# Patient Record
Sex: Female | Born: 1965
Health system: Southern US, Community
[De-identification: ages and names within clinical notes are randomized; demographics above are authoritative.]

## PROBLEM LIST (undated history)

## (undated) DIAGNOSIS — I10 Essential (primary) hypertension: Secondary | ICD-10-CM

## (undated) DIAGNOSIS — T7840XA Allergy, unspecified, initial encounter: Secondary | ICD-10-CM

## (undated) DIAGNOSIS — J302 Other seasonal allergic rhinitis: Secondary | ICD-10-CM

## (undated) HISTORY — DX: Other seasonal allergic rhinitis: J30.2

## (undated) HISTORY — PX: BREAST SURGERY: SHX581

## (undated) HISTORY — PX: ABDOMINOPLASTY: SUR9

## (undated) HISTORY — DX: Allergy, unspecified, initial encounter: T78.40XA

---
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
## 1998-03-29 ENCOUNTER — Other Ambulatory Visit: Admission: RE | Admit: 1998-03-29 | Discharge: 1998-03-29 | Payer: Self-pay | Admitting: Obstetrics

## 1998-04-14 ENCOUNTER — Encounter: Admission: RE | Admit: 1998-04-14 | Discharge: 1998-04-14 | Payer: Self-pay | Admitting: *Deleted

## 1998-08-08 ENCOUNTER — Emergency Department (HOSPITAL_COMMUNITY): Admission: EM | Admit: 1998-08-08 | Discharge: 1998-08-08 | Payer: Self-pay | Admitting: Emergency Medicine

## 1999-04-20 ENCOUNTER — Encounter: Admission: RE | Admit: 1999-04-20 | Discharge: 1999-04-20 | Payer: Self-pay | Admitting: Pediatrics

## 1999-04-22 ENCOUNTER — Encounter: Admission: RE | Admit: 1999-04-22 | Discharge: 1999-07-21 | Payer: Self-pay | Admitting: *Deleted

## 1999-10-10 ENCOUNTER — Encounter: Admission: RE | Admit: 1999-10-10 | Discharge: 1999-10-10 | Payer: Self-pay | Admitting: Family Medicine

## 1999-12-09 ENCOUNTER — Encounter: Admission: RE | Admit: 1999-12-09 | Discharge: 1999-12-09 | Payer: Self-pay | Admitting: Family Medicine

## 1999-12-12 ENCOUNTER — Encounter: Admission: RE | Admit: 1999-12-12 | Discharge: 1999-12-12 | Payer: Self-pay | Admitting: Family Medicine

## 2000-05-24 ENCOUNTER — Emergency Department (HOSPITAL_COMMUNITY): Admission: EM | Admit: 2000-05-24 | Discharge: 2000-05-24 | Payer: Self-pay | Admitting: Emergency Medicine

## 2001-06-27 ENCOUNTER — Emergency Department (HOSPITAL_COMMUNITY): Admission: EM | Admit: 2001-06-27 | Discharge: 2001-06-27 | Payer: Self-pay

## 2002-04-15 ENCOUNTER — Emergency Department (HOSPITAL_COMMUNITY): Admission: EM | Admit: 2002-04-15 | Discharge: 2002-04-15 | Payer: Self-pay | Admitting: Emergency Medicine

## 2004-05-16 ENCOUNTER — Ambulatory Visit (HOSPITAL_COMMUNITY): Admission: RE | Admit: 2004-05-16 | Discharge: 2004-05-16 | Payer: Self-pay | Admitting: Obstetrics and Gynecology

## 2005-10-23 ENCOUNTER — Emergency Department (HOSPITAL_COMMUNITY): Admission: EM | Admit: 2005-10-23 | Discharge: 2005-10-23 | Payer: Self-pay | Admitting: Emergency Medicine

## 2006-06-12 ENCOUNTER — Emergency Department (HOSPITAL_COMMUNITY): Admission: EM | Admit: 2006-06-12 | Discharge: 2006-06-13 | Payer: Self-pay | Admitting: Emergency Medicine

## 2006-07-02 ENCOUNTER — Ambulatory Visit: Payer: Self-pay | Admitting: Family Medicine

## 2006-07-16 ENCOUNTER — Ambulatory Visit: Payer: Self-pay | Admitting: Family Medicine

## 2006-10-03 ENCOUNTER — Ambulatory Visit: Payer: Self-pay | Admitting: Family Medicine

## 2006-10-03 LAB — CONVERTED CEMR LAB
ALT: 21 units/L (ref 0–40)
AST: 27 units/L (ref 0–37)
Albumin: 3.8 g/dL (ref 3.5–5.2)
Alkaline Phosphatase: 55 units/L (ref 39–117)
BUN: 8 mg/dL (ref 6–23)
Basophils Absolute: 0 10*3/uL (ref 0.0–0.1)
Basophils Relative: 0.1 % (ref 0.0–1.0)
CO2: 28 meq/L (ref 19–32)
Calcium: 9.4 mg/dL (ref 8.4–10.5)
Chloride: 102 meq/L (ref 96–112)
Chol/HDL Ratio, serum: 2.9
Cholesterol: 178 mg/dL (ref 0–200)
Creatinine, Ser: 1.1 mg/dL (ref 0.4–1.2)
Eosinophil percent: 3.6 % (ref 0.0–5.0)
GFR calc non Af Amer: 58 mL/min
Glomerular Filtration Rate, Af Am: 70 mL/min/{1.73_m2}
Glucose, Bld: 91 mg/dL (ref 70–99)
HCT: 41.5 % (ref 36.0–46.0)
HDL: 61 mg/dL (ref >39.0)
Hemoglobin: 13.2 g/dL (ref 12.0–15.0)
LDL Cholesterol: 102 mg/dL — ABNORMAL HIGH (ref 0–99)
Lymphocytes Relative: 32.8 % (ref 12.0–46.0)
MCHC: 31.8 g/dL (ref 30.0–36.0)
MCV: 86 fL (ref 78.0–100.0)
Monocytes Absolute: 0.3 10*3/uL (ref 0.2–0.7)
Monocytes Relative: 5.7 % (ref 3.0–11.0)
Neutro Abs: 2.9 10*3/uL (ref 1.4–7.7)
Neutrophils Relative %: 57.8 % (ref 43.0–77.0)
Platelets: 317 10*3/uL (ref 150–400)
Potassium: 4.3 meq/L (ref 3.5–5.1)
RBC: 4.83 M/uL (ref 3.87–5.11)
RDW: 14.1 % (ref 11.5–14.6)
Sodium: 137 meq/L (ref 135–145)
TSH: 1.11 microintl units/mL (ref 0.35–5.50)
Total Bilirubin: 0.6 mg/dL (ref 0.3–1.2)
Total Protein: 6.8 g/dL (ref 6.0–8.3)
Triglyceride fasting, serum: 75 mg/dL (ref 0–149)
Uric Acid, Serum: 5.5 mg/dL (ref 2.4–7.0)
VLDL: 15 mg/dL (ref 0–40)
WBC: 5 10*3/uL (ref 4.5–10.5)

## 2006-10-04 ENCOUNTER — Ambulatory Visit: Payer: Self-pay | Admitting: Family Medicine

## 2006-10-10 ENCOUNTER — Encounter: Admission: RE | Admit: 2006-10-10 | Discharge: 2006-10-10 | Payer: Self-pay | Admitting: Family Medicine

## 2006-11-22 ENCOUNTER — Ambulatory Visit: Payer: Self-pay | Admitting: Obstetrics & Gynecology

## 2006-11-22 ENCOUNTER — Encounter: Payer: Self-pay | Admitting: Obstetrics & Gynecology

## 2007-02-19 DIAGNOSIS — I10 Essential (primary) hypertension: Secondary | ICD-10-CM | POA: Insufficient documentation

## 2007-03-13 ENCOUNTER — Emergency Department (HOSPITAL_COMMUNITY): Admission: EM | Admit: 2007-03-13 | Discharge: 2007-03-14 | Payer: Self-pay | Admitting: Emergency Medicine

## 2007-03-15 ENCOUNTER — Inpatient Hospital Stay (HOSPITAL_COMMUNITY): Admission: AD | Admit: 2007-03-15 | Discharge: 2007-03-15 | Payer: Self-pay | Admitting: Obstetrics & Gynecology

## 2007-03-16 ENCOUNTER — Inpatient Hospital Stay (HOSPITAL_COMMUNITY): Admission: AD | Admit: 2007-03-16 | Discharge: 2007-03-16 | Payer: Self-pay | Admitting: Family Medicine

## 2007-03-24 ENCOUNTER — Inpatient Hospital Stay (HOSPITAL_COMMUNITY): Admission: AD | Admit: 2007-03-24 | Discharge: 2007-03-24 | Payer: Self-pay | Admitting: Family Medicine

## 2007-03-31 ENCOUNTER — Inpatient Hospital Stay (HOSPITAL_COMMUNITY): Admission: AD | Admit: 2007-03-31 | Discharge: 2007-03-31 | Payer: Self-pay | Admitting: Obstetrics & Gynecology

## 2007-07-04 ENCOUNTER — Ambulatory Visit: Payer: Self-pay | Admitting: Obstetrics & Gynecology

## 2007-07-16 ENCOUNTER — Ambulatory Visit: Payer: Self-pay | Admitting: Family Medicine

## 2008-05-26 ENCOUNTER — Ambulatory Visit: Payer: Self-pay | Admitting: Family Medicine

## 2008-06-09 ENCOUNTER — Encounter (INDEPENDENT_AMBULATORY_CARE_PROVIDER_SITE_OTHER): Payer: Self-pay | Admitting: *Deleted

## 2008-06-09 ENCOUNTER — Ambulatory Visit: Payer: Self-pay | Admitting: Family Medicine

## 2009-04-09 ENCOUNTER — Emergency Department (HOSPITAL_COMMUNITY): Admission: EM | Admit: 2009-04-09 | Discharge: 2009-04-09 | Payer: Self-pay | Admitting: Emergency Medicine

## 2009-07-28 ENCOUNTER — Encounter: Payer: Self-pay | Admitting: Family Medicine

## 2009-07-28 ENCOUNTER — Emergency Department (HOSPITAL_COMMUNITY): Admission: EM | Admit: 2009-07-28 | Discharge: 2009-07-28 | Payer: Self-pay | Admitting: Emergency Medicine

## 2009-08-06 ENCOUNTER — Ambulatory Visit: Payer: Self-pay | Admitting: Family Medicine

## 2009-08-06 DIAGNOSIS — R079 Chest pain, unspecified: Secondary | ICD-10-CM | POA: Insufficient documentation

## 2009-08-06 DIAGNOSIS — K219 Gastro-esophageal reflux disease without esophagitis: Secondary | ICD-10-CM | POA: Insufficient documentation

## 2009-08-09 ENCOUNTER — Encounter: Admission: RE | Admit: 2009-08-09 | Discharge: 2009-08-09 | Payer: Self-pay | Admitting: Family Medicine

## 2010-03-03 ENCOUNTER — Encounter: Admission: RE | Admit: 2010-03-03 | Discharge: 2010-03-03 | Payer: Self-pay | Admitting: Specialist

## 2010-11-15 ENCOUNTER — Inpatient Hospital Stay (HOSPITAL_COMMUNITY)
Admission: AD | Admit: 2010-11-15 | Discharge: 2010-11-15 | Payer: Self-pay | Source: Home / Self Care | Attending: Obstetrics and Gynecology | Admitting: Obstetrics and Gynecology

## 2010-11-21 LAB — POCT PREGNANCY, URINE: Preg Test, Ur: NEGATIVE

## 2010-11-21 LAB — URINALYSIS, ROUTINE W REFLEX MICROSCOPIC
Bilirubin Urine: NEGATIVE
Ketones, ur: NEGATIVE mg/dL
Nitrite: POSITIVE — AB
Protein, ur: NEGATIVE mg/dL
Specific Gravity, Urine: >1.03 — ABNORMAL HIGH (ref 1.005–1.030)
Urine Glucose, Fasting: NEGATIVE mg/dL
Urobilinogen, UA: 1 mg/dL (ref 0.0–1.0)
pH: 5.5 (ref 5.0–8.0)

## 2010-11-21 LAB — URINE MICROSCOPIC-ADD ON

## 2010-11-21 LAB — WET PREP, GENITAL
Clue Cells Wet Prep HPF POC: NONE SEEN
Trich, Wet Prep: NONE SEEN

## 2010-11-27 ENCOUNTER — Encounter: Payer: Self-pay | Admitting: Family Medicine

## 2010-12-29 ENCOUNTER — Encounter: Payer: Self-pay | Admitting: Obstetrics and Gynecology

## 2011-02-10 LAB — BASIC METABOLIC PANEL
BUN: 10 mg/dL (ref 6–23)
CO2: 32 mEq/L (ref 19–32)
Calcium: 9.6 mg/dL (ref 8.4–10.5)
Chloride: 102 mEq/L (ref 96–112)
Creatinine, Ser: 1.03 mg/dL (ref 0.4–1.2)
GFR calc Af Amer: >60 mL/min (ref >60)
GFR calc non Af Amer: 58 mL/min — ABNORMAL LOW (ref >60)
Glucose, Bld: 104 mg/dL — ABNORMAL HIGH (ref 70–99)
Potassium: 3.7 mEq/L (ref 3.5–5.1)
Sodium: 139 mEq/L (ref 135–145)

## 2011-02-10 LAB — CBC
HCT: 40.8 % (ref 36.0–46.0)
Hemoglobin: 13.3 g/dL (ref 12.0–15.0)
MCHC: 32.6 g/dL (ref 30.0–36.0)
MCV: 86.5 fL (ref 78.0–100.0)
Platelets: 273 10*3/uL (ref 150–400)
RBC: 4.71 MIL/uL (ref 3.87–5.11)
RDW: 15.5 % (ref 11.5–15.5)
WBC: 5.9 10*3/uL (ref 4.0–10.5)

## 2011-02-10 LAB — POCT CARDIAC MARKERS
CKMB, poc: <1 ng/mL — ABNORMAL LOW (ref 1.0–8.0)
Myoglobin, poc: 75.2 ng/mL (ref 12–200)
Troponin i, poc: <0.05 ng/mL (ref 0.00–0.09)

## 2011-02-10 LAB — DIFFERENTIAL
Basophils Absolute: 0.1 10*3/uL (ref 0.0–0.1)
Basophils Relative: 1 % (ref 0–1)
Eosinophils Absolute: 0.1 10*3/uL (ref 0.0–0.7)
Eosinophils Relative: 2 % (ref 0–5)
Lymphocytes Relative: 32 % (ref 12–46)
Lymphs Abs: 1.9 10*3/uL (ref 0.7–4.0)
Monocytes Absolute: 0.2 10*3/uL (ref 0.1–1.0)
Monocytes Relative: 4 % (ref 3–12)
Neutro Abs: 3.6 10*3/uL (ref 1.7–7.7)
Neutrophils Relative %: 61 % (ref 43–77)

## 2011-02-10 LAB — URINALYSIS, ROUTINE W REFLEX MICROSCOPIC
Bilirubin Urine: NEGATIVE
Glucose, UA: NEGATIVE mg/dL
Hgb urine dipstick: NEGATIVE
Ketones, ur: NEGATIVE mg/dL
Nitrite: NEGATIVE
Protein, ur: NEGATIVE mg/dL
Specific Gravity, Urine: 1.015 (ref 1.005–1.030)
Urobilinogen, UA: 0.2 mg/dL (ref 0.0–1.0)
pH: 6 (ref 5.0–8.0)

## 2011-02-10 LAB — PREGNANCY, URINE: Preg Test, Ur: NEGATIVE

## 2011-02-10 LAB — D-DIMER, QUANTITATIVE: D-Dimer, Quant: <0.22 ug/mL-FEU (ref 0.00–0.48)

## 2011-02-13 LAB — COMPREHENSIVE METABOLIC PANEL
ALT: 16 U/L (ref 0–35)
AST: 16 U/L (ref 0–37)
Albumin: 3.9 g/dL (ref 3.5–5.2)
Alkaline Phosphatase: 56 U/L (ref 39–117)
BUN: 11 mg/dL (ref 6–23)
CO2: 28 mEq/L (ref 19–32)
Calcium: 10 mg/dL (ref 8.4–10.5)
Chloride: 105 mEq/L (ref 96–112)
Creatinine, Ser: 1.09 mg/dL (ref 0.4–1.2)
GFR calc Af Amer: >60 mL/min (ref >60)
GFR calc non Af Amer: 54 mL/min — ABNORMAL LOW (ref >60)
Glucose, Bld: 98 mg/dL (ref 70–99)
Potassium: 4 mEq/L (ref 3.5–5.1)
Sodium: 140 mEq/L (ref 135–145)
Total Bilirubin: 0.3 mg/dL (ref 0.3–1.2)
Total Protein: 7.2 g/dL (ref 6.0–8.3)

## 2011-02-13 LAB — CBC
HCT: 40.3 % (ref 36.0–46.0)
Hemoglobin: 12.9 g/dL (ref 12.0–15.0)
MCHC: 32 g/dL (ref 30.0–36.0)
MCV: 85.9 fL (ref 78.0–100.0)
Platelets: 250 10*3/uL (ref 150–400)
RBC: 4.69 MIL/uL (ref 3.87–5.11)
RDW: 15.4 % (ref 11.5–15.5)
WBC: 6.7 10*3/uL (ref 4.0–10.5)

## 2011-02-13 LAB — URINALYSIS, ROUTINE W REFLEX MICROSCOPIC
Bilirubin Urine: NEGATIVE
Glucose, UA: NEGATIVE mg/dL
Hgb urine dipstick: NEGATIVE
Ketones, ur: NEGATIVE mg/dL
Nitrite: NEGATIVE
Protein, ur: NEGATIVE mg/dL
Specific Gravity, Urine: 1.024 (ref 1.005–1.030)
Urobilinogen, UA: 1 mg/dL (ref 0.0–1.0)
pH: 6.5 (ref 5.0–8.0)

## 2011-02-13 LAB — URINE MICROSCOPIC-ADD ON

## 2011-02-13 LAB — DIFFERENTIAL
Basophils Absolute: 0.1 10*3/uL (ref 0.0–0.1)
Basophils Relative: 1 % (ref 0–1)
Eosinophils Absolute: 0.1 10*3/uL (ref 0.0–0.7)
Eosinophils Relative: 2 % (ref 0–5)
Lymphocytes Relative: 34 % (ref 12–46)
Lymphs Abs: 2.3 10*3/uL (ref 0.7–4.0)
Monocytes Absolute: 0.3 10*3/uL (ref 0.1–1.0)
Monocytes Relative: 5 % (ref 3–12)
Neutro Abs: 3.9 10*3/uL (ref 1.7–7.7)
Neutrophils Relative %: 58 % (ref 43–77)

## 2011-02-13 LAB — GC/CHLAMYDIA PROBE AMP, GENITAL
Chlamydia, DNA Probe: NEGATIVE
GC Probe Amp, Genital: NEGATIVE

## 2011-02-13 LAB — WET PREP, GENITAL

## 2011-03-21 NOTE — Group Therapy Note (Signed)
Sharon [name], Sharon [name] NO.:  [id number]   MEDICAL RECORD NO.:  [id number]          PATIENT TYPE:  WOC   LOCATION:  [facility]                   FACILITY:  [facility]   PHYSICIAN:  Johnella Moloney, MD        DATE OF BIRTH:  02/05/1966   DATE OF SERVICE:  07/04/2007                                  CLINIC NOTE   CHIEF COMPLAINT:  Lower abdominal pain, urinary frequency and urgency.   HISTORY OF PRESENT ILLNESS:  The patient is a 45 year old gravida 3,  para 2 who is here with a complaint of lower abdominal pain, increased  urinary frequency and urgency. The patient reported that her symptoms  started 2 weeks ago and denies any fevers, chills, sweats, dysuria or  hematuria or any other symptoms.  Of note, patient also reports that she  has a new partner in her life and is interested in information about  getting pregnant at age 45.  The patient denies any other symptoms.  The  patient has no interval change in medical history. For further details  please refer to Dr. Leanora Ivanoff Dove's note on November 22, 2006.  She had a  normal Pap smear in January 2008 and normal mammogram in December 2007.   PHYSICAL EXAMINATION:  Temperature 97.2, pulse is 63, blood pressure  142/94, weight 219 pounds.  GENERAL:  In no apparent distress.  ABDOMEN:  Soft.  Mild suprapubic tenderness on palpation.  No rebound or  guarding.  PELVIC:  Exam deferred.   ASSESSMENT/PLAN:  The patient is a 45 year old G3, P3 here for her lower  abdominal pain and urinary frequency and urgency.  Given her symptoms  and suprapubic pain noted on examination we will empirically prescribe  ciprofloxacin 500 mg p.o. t.i.d. for 7 days for treatment of a urinary  tract infection.  We will also send urinalysis and urine culture and  sensitivity for further evaluation.  The patient was told to call in a  few days for results of these evaluations.  As for patient's question of  getting pregnant at age 45, she was told that  she is at increased risk  of chromosomal abnormalities and also increased risk of miscarriages.  The patient was advised that if she was trying to get pregnant that she  should be on prenatal vitamins but to discuss with her partner that this  will be a high-risk pregnancy if she does conceive at this age. The  patient verbalized understanding of plan and will follow up results of  her lab tests.           ______________________________  Johnella Moloney, MD     UD/MEDQ  D:  07/04/2007  T:  07/05/2007  Job:  [id number]

## 2011-03-24 NOTE — Group Therapy Note (Signed)
NAME:  MAIGEN, MOZINGO NO.:  [id number]   MEDICAL RECORD NO.:  [id number]          PATIENT TYPE:  WOC   LOCATION:  [facility]                   FACILITY:  [facility]   PHYSICIAN:  Allie Bossier, MD        DATE OF BIRTH:  [birth year]   DATE OF SERVICE:                                  CLINIC NOTE   Ms. Vankirk is a 45 year old single black gravida 3, para 2, abortus 1,  with 2 teenage sons who comes in for her Pap smear.  She reports that  she had her physical exam, including breast exam, done by Dr. Loreen Freud at [facility] last month.  She had a mammogram done at that  time, which was reportedly normal.  She comes in for her Pap and pelvic.  She also complains of a 1-year history of stress incontinence becoming  worse over the last several months.  Her stress incontinence actually  began at the birth of her second child 13 years ago, but resolved within  a year and has recurred recently.  She has tried Kegels in the past and  has tried them recently without good results at this point.   PAST MEDICAL HISTORY:  Significant for obesity, chronic hypertension,  and the new-onset stress incontinence.   SURGICAL HISTORY:  She had a D&C for retained products.  She had a  breast biopsy at age 53 to 45 years of age.   REVIEW OF SYSTEMS:  She works at the [employer].  She uses condoms when  she has sex.  She reports that she has sex approximately monthly and has  reported some right lower quadrant dyspareunia for the last 2 months.   No known drug allergies.   MEDICATIONS:  1. Hydrochlorothiazide 12.5 mg daily.  2. She takes multivitamins on a +/- basis.   PHYSICAL EXAM:  Weight 218 pounds, blood pressure 174/105 (she did not  take her medicine today).  PELVIC:  External genitalia is normal.  Cervix is normal.  Her uterus is  10 week size, globular, consistent with fibroid.  It is mobile and  nontender.  Adnexa are nontender without masses and no enlargement.   ASSESSMENT AND PLAN:  1. Annual exam.  Obtained a Pap smear.  2. With regard to her new-onset dyspareunia, I have obtained GC and      chlamydia cultures with her Pap smear.  3. With regard to her stress incontinence, I have discussed my      recommendation for a TVT sling along with its benefits and risks.      She will schedule a preoperative visit if she decides to.  I have      given her information on the      web site for further information.  Of course, I have recommended      she take her antihypertensives and lose weight.      Allie Bossier, MD     MCD/MEDQ  D:  11/22/2006  T:  11/22/2006  Job:  [id number]

## 2011-11-15 ENCOUNTER — Ambulatory Visit (INDEPENDENT_AMBULATORY_CARE_PROVIDER_SITE_OTHER): Payer: Federal, State, Local not specified - PPO

## 2011-11-15 ENCOUNTER — Ambulatory Visit: Payer: Federal, State, Local not specified - PPO

## 2011-11-15 DIAGNOSIS — Z0000 Encounter for general adult medical examination without abnormal findings: Secondary | ICD-10-CM

## 2011-11-15 DIAGNOSIS — M542 Cervicalgia: Secondary | ICD-10-CM

## 2011-11-15 DIAGNOSIS — IMO0001 Reserved for inherently not codable concepts without codable children: Secondary | ICD-10-CM

## 2011-11-15 DIAGNOSIS — N898 Other specified noninflammatory disorders of vagina: Secondary | ICD-10-CM

## 2011-12-14 ENCOUNTER — Encounter (HOSPITAL_BASED_OUTPATIENT_CLINIC_OR_DEPARTMENT_OTHER): Payer: Self-pay | Admitting: *Deleted

## 2011-12-14 ENCOUNTER — Emergency Department (HOSPITAL_BASED_OUTPATIENT_CLINIC_OR_DEPARTMENT_OTHER)
Admission: EM | Admit: 2011-12-14 | Discharge: 2011-12-14 | Disposition: A | Discharge status: Home / Self Care | Payer: Federal, State, Local not specified - PPO | Attending: Emergency Medicine | Admitting: Emergency Medicine

## 2011-12-14 DIAGNOSIS — R109 Unspecified abdominal pain: Secondary | ICD-10-CM | POA: Insufficient documentation

## 2011-12-14 DIAGNOSIS — B9689 Other specified bacterial agents as the cause of diseases classified elsewhere: Secondary | ICD-10-CM | POA: Insufficient documentation

## 2011-12-14 DIAGNOSIS — A499 Bacterial infection, unspecified: Secondary | ICD-10-CM | POA: Insufficient documentation

## 2011-12-14 DIAGNOSIS — N760 Acute vaginitis: Secondary | ICD-10-CM | POA: Insufficient documentation

## 2011-12-14 DIAGNOSIS — N912 Amenorrhea, unspecified: Secondary | ICD-10-CM | POA: Insufficient documentation

## 2011-12-14 DIAGNOSIS — I10 Essential (primary) hypertension: Secondary | ICD-10-CM | POA: Insufficient documentation

## 2011-12-14 HISTORY — DX: Essential (primary) hypertension: I10

## 2011-12-14 LAB — CBC
HCT: 38.3 % (ref 36.0–46.0)
MCH: 27.7 pg (ref 26.0–34.0)
MCHC: 33.9 g/dL (ref 30.0–36.0)
MCV: 81.5 fL (ref 78.0–100.0)
RDW: 14.3 % (ref 11.5–15.5)

## 2011-12-14 LAB — DIFFERENTIAL
Basophils Absolute: 0 10*3/uL (ref 0.0–0.1)
Basophils Relative: 0 % (ref 0–1)
Eosinophils Absolute: 0.1 10*3/uL (ref 0.0–0.7)
Eosinophils Relative: 2 % (ref 0–5)
Monocytes Absolute: 0.5 10*3/uL (ref 0.1–1.0)
Neutro Abs: 3.8 10*3/uL (ref 1.7–7.7)

## 2011-12-14 LAB — URINALYSIS, ROUTINE W REFLEX MICROSCOPIC
Hgb urine dipstick: NEGATIVE
Protein, ur: NEGATIVE mg/dL
Urobilinogen, UA: 1 mg/dL (ref 0.0–1.0)

## 2011-12-14 LAB — BASIC METABOLIC PANEL
Calcium: 10 mg/dL (ref 8.4–10.5)
Creatinine, Ser: 1.2 mg/dL — ABNORMAL HIGH (ref 0.50–1.10)
GFR calc Af Amer: 61 mL/min — ABNORMAL LOW (ref >90)
GFR calc non Af Amer: 53 mL/min — ABNORMAL LOW (ref >90)

## 2011-12-14 LAB — PREGNANCY, URINE: Preg Test, Ur: NEGATIVE

## 2011-12-14 MED ORDER — METRONIDAZOLE 500 MG PO TABS: 500.0000 mg | ORAL_TABLET | Freq: Two times a day (BID) | ORAL | Status: AC

## 2011-12-14 NOTE — ED Notes (Signed)
Irregular periods has taken 2 pregnancy test negative lower abdominal pain no spotting no vaginal discharge

## 2011-12-14 NOTE — ED Provider Notes (Signed)
History     CSN: [id number]  Arrival date & time 12/14/11  1658   First MD Initiated Contact with Patient 12/14/11 1717      Chief Complaint  Patient presents with  . Amenorrhea  . Abdominal Pain    (Consider location/radiation/quality/duration/timing/severity/associated sxs/prior treatment) HPI Comments: Pt states that since October she has had intermittent periods:pt states that she has taken 2 pregnancy test which is negative  Patient is a 46 y.o. female presenting with abdominal pain. The history is provided by the patient. No language interpreter was used.  Abdominal Pain The primary symptoms of the illness include abdominal pain. The primary symptoms of the illness do not include fever, nausea, vomiting, dysuria, vaginal discharge or vaginal bleeding. The current episode started more than 2 days ago. The onset of the illness was gradual. The problem has not changed since onset. The patient states that she believes she is currently not pregnant. The patient has not had a change in bowel habit. Symptoms associated with the illness do not include urgency, hematuria, frequency or back pain.    Past Medical History  Diagnosis Date  . Hypertension     History reviewed. No pertinent past surgical history.  History reviewed. No pertinent family history.  History  Substance Use Topics  . Smoking status: Never Smoker   . Smokeless tobacco: Not on file  . Alcohol Use: No    OB History    Grav Para Term Preterm Abortions TAB SAB Ect Mult Living                  Review of Systems  Constitutional: Negative for fever.  Gastrointestinal: Positive for abdominal pain. Negative for nausea and vomiting.  Genitourinary: Negative for dysuria, urgency, frequency, hematuria, vaginal bleeding and vaginal discharge.  Musculoskeletal: Negative for back pain.  All other systems reviewed and are negative.    Allergies  Codeine  Home Medications   Current Outpatient Rx  Name Route  Sig Dispense Refill  . HYDROCHLOROTHIAZIDE 12.5 MG PO CAPS Oral Take 12.5 mg by mouth every evening.    Marland Kitchen LISINOPRIL 20 MG PO TABS Oral Take 20 mg by mouth every evening.    . ADULT MULTIVITAMIN LIQUID CH Oral Take 5 mLs by mouth daily.      BP 146/88  Pulse 64  Temp 97.5 F (36.4 C)  Resp 18  SpO2 100%  LMP 08/25/2011  Physical Exam  Nursing note and vitals reviewed. Constitutional: She is oriented to person, place, and time. She appears well-developed and well-nourished.  HENT:  Head: Normocephalic and atraumatic.  Eyes: Conjunctivae and EOM are normal.  Cardiovascular: Normal rate and regular rhythm.   Pulmonary/Chest: Effort normal and breath sounds normal.  Abdominal: Soft. Bowel sounds are normal. There is tenderness in the suprapubic area.  Genitourinary: Cervix exhibits no motion tenderness.       Pt is having white vaginal discharge  Musculoskeletal: Normal range of motion.  Neurological: She is alert and oriented to person, place, and time.  Skin: Skin is warm and dry.  Psychiatric: She has a normal mood and affect.    ED Course  Procedures (including critical care time)  Labs Reviewed  URINALYSIS, ROUTINE W REFLEX MICROSCOPIC - Abnormal; Notable for the following:    APPearance CLOUDY (*)    All other components within normal limits  BASIC METABOLIC PANEL - Abnormal; Notable for the following:    Creatinine, Ser 1.20 (*)    GFR calc non Af Amer 53 (*)  GFR calc Af Amer 61 (*)    All other components within normal limits  WET PREP, GENITAL - Abnormal; Notable for the following:    Clue Cells Wet Prep HPF POC MANY (*)    WBC, Wet Prep HPF POC FEW (*)    All other components within normal limits  PREGNANCY, URINE  CBC  DIFFERENTIAL  GC/CHLAMYDIA PROBE AMP, GENITAL   No results found.   1. Amenorrhea   2. BV (bacterial vaginosis)       MDM  Will treat for bv:pt to come back tomorrow for ultrasound for fibroids or cyst:amenorrhea likely related to  menopause       Teressa Lower, NP 12/14/11 1900

## 2011-12-14 NOTE — ED Notes (Signed)
Attempted venipuncture x 2 in right hand and left ac.  EMT attempted in right ac x 1.

## 2011-12-15 ENCOUNTER — Ambulatory Visit (INDEPENDENT_AMBULATORY_CARE_PROVIDER_SITE_OTHER)
Admission: RE | Admit: 2011-12-15 | Discharge: 2011-12-15 | Disposition: A | Discharge status: Home / Self Care | Payer: Federal, State, Local not specified - PPO | Source: Ambulatory Visit | Attending: Nurse Practitioner | Admitting: Nurse Practitioner

## 2011-12-15 ENCOUNTER — Ambulatory Visit (HOSPITAL_BASED_OUTPATIENT_CLINIC_OR_DEPARTMENT_OTHER)
Admission: RE | Admit: 2011-12-15 | Discharge: 2011-12-15 | Disposition: A | Discharge status: Home / Self Care | Payer: Federal, State, Local not specified - PPO | Source: Ambulatory Visit | Attending: Emergency Medicine | Admitting: Emergency Medicine

## 2011-12-15 ENCOUNTER — Other Ambulatory Visit (HOSPITAL_BASED_OUTPATIENT_CLINIC_OR_DEPARTMENT_OTHER): Payer: Self-pay | Admitting: Nurse Practitioner

## 2011-12-15 DIAGNOSIS — R109 Unspecified abdominal pain: Secondary | ICD-10-CM

## 2011-12-15 DIAGNOSIS — N83209 Unspecified ovarian cyst, unspecified side: Secondary | ICD-10-CM

## 2011-12-15 DIAGNOSIS — N949 Unspecified condition associated with female genital organs and menstrual cycle: Secondary | ICD-10-CM

## 2011-12-15 DIAGNOSIS — N946 Dysmenorrhea, unspecified: Secondary | ICD-10-CM

## 2011-12-16 LAB — GC/CHLAMYDIA PROBE AMP, GENITAL: GC Probe Amp, Genital: NEGATIVE

## 2011-12-17 NOTE — ED Provider Notes (Signed)
Medical screening examination/treatment/procedure(s) were performed by non-physician practitioner and as supervising physician I was immediately available for consultation/collaboration.   Avon Mergenthaler W. Majid Mccravy, MD 12/17/11 2006 

## 2012-01-06 ENCOUNTER — Ambulatory Visit (INDEPENDENT_AMBULATORY_CARE_PROVIDER_SITE_OTHER): Payer: Federal, State, Local not specified - PPO | Admitting: Physician Assistant

## 2012-01-06 VITALS — BP 123/82 | HR 59 | Temp 98.5°F | Resp 18 | Ht 66.0 in | Wt 225.0 lb

## 2012-01-06 DIAGNOSIS — R197 Diarrhea, unspecified: Secondary | ICD-10-CM

## 2012-01-06 DIAGNOSIS — R112 Nausea with vomiting, unspecified: Secondary | ICD-10-CM

## 2012-01-06 MED ORDER — DICYCLOMINE HCL 20 MG PO TABS: 20.0000 mg | ORAL_TABLET | Freq: Four times a day (QID) | ORAL | Status: DC

## 2012-01-06 MED ORDER — PROMETHAZINE HCL 25 MG PO TABS: 25.0000 mg | ORAL_TABLET | Freq: Four times a day (QID) | ORAL | Status: DC | PRN

## 2012-01-06 NOTE — Progress Notes (Signed)
  Subjective:    Patient ID: Sharon [name], female    DOB: 11-Mar-1966, 46 y.o.   MRN: [id number]  HPI 2d ago developed diarrhea and vomiting after eating [employer]. This persisted the rest of the day.  Yesterday she still had a little diarrhea and nausea all day without further vomiting.  Today she has just felt fatigued and weak and when she tries to eat, she gets abdominal cramping as though she is going to start having diarrhea again.    Review of Systems  All other systems reviewed and are negative.       Objective:   Physical Exam  Constitutional: She is oriented to person, place, and time. She appears well-developed and well-nourished.       Make-up, well dressed  HENT:  Head: Normocephalic and atraumatic.  Mouth/Throat: Oropharynx is clear and moist. No oropharyngeal exudate (mmm).  Neck: Normal range of motion. Neck supple.  Cardiovascular: Normal rate, regular rhythm and normal heart sounds.  Exam reveals no gallop.   No murmur heard. Pulmonary/Chest: Effort normal and breath sounds normal.  Abdominal: Soft. Bowel sounds are normal. She exhibits no distension and no mass. There is no tenderness (neg Murphy's, neg McBurney's.  No acute abd signs). There is no rebound and no guarding.  Neurological: She is alert and oriented to person, place, and time. No cranial nerve deficit.  Skin: Skin is warm and dry.  Psychiatric: She has a normal mood and affect. Her behavior is normal.          Assessment & Plan:  Resolving gastroenteritis. Meds to pharmacy for symptoms.

## 2012-02-22 ENCOUNTER — Other Ambulatory Visit: Payer: Self-pay | Admitting: Physician Assistant

## 2012-02-26 ENCOUNTER — Other Ambulatory Visit: Payer: Self-pay | Admitting: Physician Assistant

## 2012-03-01 NOTE — Telephone Encounter (Signed)
Pt states pharmacy is telling her she needs to call us for a rx refill acydovir 400mg , please contact pt @ [phone]

## 2012-03-02 NOTE — Telephone Encounter (Signed)
Patient notified rx sent in.

## 2012-04-16 ENCOUNTER — Ambulatory Visit (INDEPENDENT_AMBULATORY_CARE_PROVIDER_SITE_OTHER): Payer: Federal, State, Local not specified - PPO | Admitting: Physician Assistant

## 2012-04-16 VITALS — BP 162/91 | HR 86 | Temp 98.2°F | Resp 16 | Ht 64.5 in | Wt 228.0 lb

## 2012-04-16 DIAGNOSIS — N938 Other specified abnormal uterine and vaginal bleeding: Secondary | ICD-10-CM

## 2012-04-16 DIAGNOSIS — Z209 Contact with and (suspected) exposure to unspecified communicable disease: Secondary | ICD-10-CM

## 2012-04-16 DIAGNOSIS — Z2089 Contact with and (suspected) exposure to other communicable diseases: Secondary | ICD-10-CM

## 2012-04-16 DIAGNOSIS — N926 Irregular menstruation, unspecified: Secondary | ICD-10-CM

## 2012-04-16 DIAGNOSIS — N949 Unspecified condition associated with female genital organs and menstrual cycle: Secondary | ICD-10-CM

## 2012-04-16 DIAGNOSIS — Z202 Contact with and (suspected) exposure to infections with a predominantly sexual mode of transmission: Secondary | ICD-10-CM

## 2012-04-16 DIAGNOSIS — I10 Essential (primary) hypertension: Secondary | ICD-10-CM

## 2012-04-16 LAB — POCT CBC
Lymph, poc: 2.3 (ref 0.6–3.4)
MCH, POC: 26.9 pg — AB (ref 27–31.2)
MCHC: 32.3 g/dL (ref 31.8–35.4)
MPV: 7.9 fL (ref 0–99.8)
POC Granulocyte: 4.3 (ref 2–6.9)
POC LYMPH PERCENT: 32.3 %L (ref 10–50)
POC MID %: 6.8 %M (ref 0–12)
RDW, POC: 15.1 %
WBC: 7.1 10*3/uL (ref 4.6–10.2)

## 2012-04-16 LAB — POCT WET PREP WITH KOH: RBC Wet Prep HPF POC: NEGATIVE

## 2012-04-16 MED ORDER — HYDROCHLOROTHIAZIDE 12.5 MG PO CAPS: 12.5000 mg | ORAL_CAPSULE | Freq: Every evening | ORAL | Status: DC

## 2012-04-16 MED ORDER — LISINOPRIL 20 MG PO TABS: 20.0000 mg | ORAL_TABLET | Freq: Every evening | ORAL | Status: DC

## 2012-04-16 MED ORDER — FLUTICASONE PROPIONATE 50 MCG/ACT NA SUSP: 2.0000 | Freq: Every day | NASAL | Status: DC

## 2012-04-16 MED ORDER — ACYCLOVIR 400 MG PO TABS: 400.0000 mg | ORAL_TABLET | Freq: Two times a day (BID) | ORAL | Status: DC

## 2012-04-16 NOTE — Progress Notes (Signed)
  Subjective:    Patient ID: Sharon [name], female    DOB: [birth year], 46 y.o.   MRN: [id number]  HPI 46 yo AAF here for STD check. She is monogamous with her partner, but at times he has been with other women and hasn't always used condoms.  She is not having any S/Sx of STDs. She has been occasionally skipping periods. Hasn't had a period in about 6 weeks.  Also c/o residual congestion s/p recent URI.  BP is up tonight, but she hasn't had her medication yet.  Review of Systems  All other systems reviewed and are negative.       Objective:   Physical Exam  Nursing note and vitals reviewed. Constitutional: She is oriented to person, place, and time. She appears well-developed and well-nourished.  HENT:  Head: Normocephalic and atraumatic.  Mouth/Throat: Oropharynx is clear and moist.       TM B bulging, no infxn.  Turbinates pale, boggy, and enlarged.  +allergic shiners B.  Neck: Normal range of motion.  Cardiovascular: Normal rate, regular rhythm and normal heart sounds.   Pulmonary/Chest: Effort normal and breath sounds normal.  Neurological: She is alert and oriented to person, place, and time.  Skin: Skin is warm and dry.     Results for orders placed in visit on 04/16/12  POCT CBC      Component Value Range   WBC 7.1  4.6 - 10.2 (K/uL)   Lymph, poc 2.3  0.6 - 3.4    POC LYMPH PERCENT 32.3  10 - 50 (%L)   MID (cbc) 0.5  0 - 0.9    POC MID % 6.8  0 - 12 (%M)   POC Granulocyte 4.3  2 - 6.9    Granulocyte percent 60.9  37 - 80 (%G)   RBC 4.42  4.04 - 5.48 (M/uL)   Hemoglobin 11.9 (*) 12.2 - 16.2 (g/dL)   HCT, POC 40.9 (*) 81.1 - 47.9 (%)   MCV 83.2  80 - 97 (fL)   MCH, POC 26.9 (*) 27 - 31.2 (pg)   MCHC 32.3  31.8 - 35.4 (g/dL)   RDW, POC 91.4     Platelet Count, POC 377  142 - 424 (K/uL)   MPV 7.9  0 - 99.8 (fL)  POCT WET PREP WITH KOH      Component Value Range   Trichomonas, UA Negative     Clue Cells Wet Prep HPF POC 5-8     Epithelial Wet Prep HPF POC 1-2     Yeast Wet Prep HPF POC neg     Bacteria Wet Prep HPF POC 2+     RBC Wet Prep HPF POC neg     WBC Wet Prep HPF POC 2-4     KOH Prep POC Negative    POCT URINE PREGNANCY      Component Value Range   Preg Test, Ur Negative     Pt. Performed self wet-prep     Assessment & Plan:  Possible STD exposure.  No signs or symptoms.  Safe sex discussed.  Pap was normal in January. Htn-controlled when she takes her meds-she hasn't had them today. Congestion/allergies-flonase

## 2012-04-18 LAB — COMPREHENSIVE METABOLIC PANEL
AST: 15 U/L (ref 0–37)
Albumin: 4.1 g/dL (ref 3.5–5.2)
BUN: 8 mg/dL (ref 6–23)
Calcium: 9.7 mg/dL (ref 8.4–10.5)
Chloride: 104 mEq/L (ref 96–112)
Creat: 0.97 mg/dL (ref 0.50–1.10)
Glucose, Bld: 143 mg/dL — ABNORMAL HIGH (ref 70–99)
Potassium: 3.5 mEq/L (ref 3.5–5.3)

## 2012-04-18 LAB — GC/CHLAMYDIA PROBE AMP, URINE: Chlamydia, Swab/Urine, PCR: NEGATIVE

## 2012-04-18 LAB — HIV ANTIBODY (ROUTINE TESTING W REFLEX): HIV: NONREACTIVE

## 2012-04-19 ENCOUNTER — Telehealth: Payer: Self-pay

## 2012-04-19 NOTE — Telephone Encounter (Signed)
Message for Sharon [name]-patient was seen two days ago maybe and still having symptoms of ear pain.

## 2012-04-20 NOTE — Telephone Encounter (Signed)
Return for recheck

## 2012-04-21 NOTE — Telephone Encounter (Signed)
ADVISED PT TO RTC.  PT SEEMS LIKE SHE DID NOT WANT TO BECAUSE OF ANOTHER COPAY, BUT WILL RTC

## 2012-09-20 ENCOUNTER — Telehealth: Payer: Self-pay

## 2012-09-20 NOTE — Telephone Encounter (Signed)
I called patient to advise.  

## 2012-09-20 NOTE — Telephone Encounter (Signed)
Pt dropped off DSS form to be completed for foster program. Pt stated that DSS does not need the TB test and just need the exam to have been w/in 1 year. Maralyn Sago, can you fill this out according to info from your 11/2011 CPE? Pt's chart and form are in your box.

## 2012-09-20 NOTE — Telephone Encounter (Signed)
She needs a recheck for her HTN 1st.

## 2012-10-05 ENCOUNTER — Ambulatory Visit (INDEPENDENT_AMBULATORY_CARE_PROVIDER_SITE_OTHER): Payer: Federal, State, Local not specified - PPO | Admitting: Family Medicine

## 2012-10-05 VITALS — BP 140/90 | HR 64 | Temp 98.7°F | Resp 16 | Ht 65.0 in | Wt 227.0 lb

## 2012-10-05 DIAGNOSIS — K047 Periapical abscess without sinus: Secondary | ICD-10-CM

## 2012-10-05 DIAGNOSIS — J310 Chronic rhinitis: Secondary | ICD-10-CM

## 2012-10-05 DIAGNOSIS — B009 Herpesviral infection, unspecified: Secondary | ICD-10-CM

## 2012-10-05 DIAGNOSIS — I10 Essential (primary) hypertension: Secondary | ICD-10-CM

## 2012-10-05 DIAGNOSIS — J329 Chronic sinusitis, unspecified: Secondary | ICD-10-CM

## 2012-10-05 DIAGNOSIS — K044 Acute apical periodontitis of pulpal origin: Secondary | ICD-10-CM

## 2012-10-05 DIAGNOSIS — R059 Cough, unspecified: Secondary | ICD-10-CM

## 2012-10-05 DIAGNOSIS — R05 Cough: Secondary | ICD-10-CM

## 2012-10-05 MED ORDER — AMOXICILLIN 875 MG PO TABS: 875.0000 mg | ORAL_TABLET | Freq: Two times a day (BID) | ORAL | Status: DC

## 2012-10-05 MED ORDER — LISINOPRIL-HYDROCHLOROTHIAZIDE 20-12.5 MG PO TABS: 1.0000 | ORAL_TABLET | Freq: Every day | ORAL | Status: DC

## 2012-10-05 MED ORDER — ACYCLOVIR 400 MG PO TABS: 400.0000 mg | ORAL_TABLET | Freq: Two times a day (BID) | ORAL | Status: DC

## 2012-10-05 MED ORDER — FLUTICASONE PROPIONATE 50 MCG/ACT NA SUSP: 2.0000 | Freq: Every day | NASAL | Status: DC

## 2012-10-05 MED ORDER — BENZONATATE 100 MG PO CAPS: ORAL_CAPSULE | ORAL | Status: DC

## 2012-10-05 NOTE — Progress Notes (Signed)
Subjective: Patient is here for a number of things. Over the past week she's had a respiratory tract infection. She's had a left-sided sore throat. She also has a bad tooth on the left side and she wonders whether the pain could be coming from that. She's had pus drainage and cough. Her eyes have been puffy in the morning. Her nose is chronically intermittently congested and she uses some fluticasone spray for that. Her prescription is out. She had been on lisinopril and hydrochlorothiazide prescription separately, and would like him back in a combined fashion. She's been on blood pressure medicine for a long time. 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 Otherwise she does pretty well. She needs a form completed for the Department [employer] Social [employer] for foster care. She has had a physical earlier this year.  Objective: TMs normal. Eyes look okay right now. Nose is a little congested. Throat he has some erythema down the left side of her throat. Had some dental deterioration and needs attention to that. Neck supple without significant nodes. Chest clear. Heart regular without murmurs. And soft nontender.  Assessment: Hypertension Remote history of HSV on blood test, never has had an outbreak URI Cough Rhinitis History of intermittent indigestion Allergies eyes Per dentition  Plan: Filled out the form for her.  Combined her lisinopril and hydrochlorothiazide. Suggested she may not need to take the acyclovir since she has never had any outbreaks Antibiotics for sinuses and teeth Needs to see a dentist

## 2012-10-05 NOTE — Patient Instructions (Addendum)
Take Allegra one daily for allergies  Take the blood pressure medication one daily  the combined pill  Continue the nose spray.  Tessalon for cough  Amoxicillin for infection

## 2012-12-09 ENCOUNTER — Other Ambulatory Visit: Payer: Self-pay | Admitting: Family Medicine

## 2012-12-09 ENCOUNTER — Other Ambulatory Visit: Payer: Self-pay | Admitting: Physician Assistant

## 2012-12-09 DIAGNOSIS — Z1231 Encounter for screening mammogram for malignant neoplasm of breast: Secondary | ICD-10-CM

## 2013-01-06 ENCOUNTER — Inpatient Hospital Stay: Admission: RE | Admit: 2013-01-06 | Payer: Federal, State, Local not specified - PPO | Source: Ambulatory Visit

## 2013-01-20 ENCOUNTER — Ambulatory Visit: Payer: Federal, State, Local not specified - PPO

## 2013-02-03 ENCOUNTER — Ambulatory Visit: Payer: Federal, State, Local not specified - PPO

## 2013-04-22 ENCOUNTER — Ambulatory Visit (INDEPENDENT_AMBULATORY_CARE_PROVIDER_SITE_OTHER): Payer: Federal, State, Local not specified - PPO | Admitting: Emergency Medicine

## 2013-04-22 VITALS — BP 152/90 | HR 60 | Temp 98.2°F | Resp 16 | Ht 65.5 in | Wt 229.8 lb

## 2013-04-22 DIAGNOSIS — S335XXA Sprain of ligaments of lumbar spine, initial encounter: Secondary | ICD-10-CM

## 2013-04-22 DIAGNOSIS — IMO0001 Reserved for inherently not codable concepts without codable children: Secondary | ICD-10-CM

## 2013-04-22 DIAGNOSIS — L710 Perioral dermatitis: Secondary | ICD-10-CM

## 2013-04-22 DIAGNOSIS — R350 Frequency of micturition: Secondary | ICD-10-CM

## 2013-04-22 DIAGNOSIS — L719 Rosacea, unspecified: Secondary | ICD-10-CM

## 2013-04-22 LAB — POCT URINALYSIS DIPSTICK
Bilirubin, UA: NEGATIVE
Glucose, UA: NEGATIVE
Nitrite, UA: NEGATIVE
Spec Grav, UA: >=1.03
Urobilinogen, UA: 0.2

## 2013-04-22 LAB — POCT UA - MICROSCOPIC ONLY
Casts, Ur, LPF, POC: NEGATIVE
Mucus, UA: NEGATIVE

## 2013-04-22 MED ORDER — NAPROXEN SODIUM 550 MG PO TABS: 550.0000 mg | ORAL_TABLET | Freq: Two times a day (BID) | ORAL | Status: DC

## 2013-04-22 MED ORDER — CYCLOBENZAPRINE HCL 10 MG PO TABS
10.0000 mg | ORAL_TABLET | Freq: Three times a day (TID) | ORAL | Status: DC | PRN
Start: 2013-04-22 — End: 2013-05-27

## 2013-04-22 NOTE — Addendum Note (Signed)
Addended by: Carmelina Dane on: 04/22/2013 05:05 PM   Modules accepted: Orders

## 2013-04-22 NOTE — Progress Notes (Signed)
[facility] [address], [place] [address] [phone]  Date:  04/22/2013   Name:  Sharon [name]   DOB:  June 12, 1966   MRN:  [id number]  PCP:  Loreen Freud, DO    Chief Complaint: Urinary Frequency   History of Present Illness:  Sharon [name] is a 47 y.o. very pleasant female patient who presents with the following:  Lower abdominal and low back pain associated with frequency and urgency, small voids.   Has foul smelling urine.  No dysuria.  No dyspareunia, discharge or vaginal bleeding.  No nausea or vomiting.  Present for two weeks.   Has a two month duration rash around her mouth mostly on left upper lip.  No response to cortaid cream BID.   No improvement with over the counter medications or other home remedies. Denies other complaint or health concern today.   Patient Active Problem List   Diagnosis Date Noted  . GERD 08/06/2009  . CHEST PAIN UNSPECIFIED 08/06/2009  . MORBID OBESITY 05/26/2008  . HYPERTENSION 02/19/2007    Past Medical History  Diagnosis Date  . Hypertension   . Seasonal allergies     Past Surgical History  Procedure Laterality Date  . Breast surgery      History  Substance Use Topics  . Smoking status: Never Smoker   . Smokeless tobacco: Not on file  . Alcohol Use: No    Family History  Problem Relation Age of Onset  . Diabetes Mother     Allergies  Allergen Reactions  . Codeine Nausea And Vomiting and Other (See Comments)    Light headedness    Medication list has been reviewed and updated.  Current Outpatient Prescriptions on File Prior to Visit  Medication Sig Dispense Refill  . acyclovir (ZOVIRAX) 400 MG tablet Take 1 tablet (400 mg total) by mouth 2 (two) times daily.  60 tablet  11  . fluticasone (FLONASE) 50 MCG/ACT nasal spray Place 2 sprays into the nose daily.  16 g  6  . lisinopril-hydrochlorothiazide (ZESTORETIC) 20-12.5 MG per tablet Take 1 tablet by mouth daily.  90 tablet  3  . amoxicillin  (AMOXIL) 875 MG tablet Take 1 tablet (875 mg total) by mouth 2 (two) times daily.  20 tablet  0  . benzonatate (TESSALON) 100 MG capsule Use 1-2 tablets 3 times daily as necessary for cough. May be used with other cough medicines if needed.  30 capsule  0  . dicyclomine (BENTYL) 20 MG tablet Take 1 tablet (20 mg total) by mouth every 6 (six) hours.  40 tablet  0  . Multiple Vitamin (MULITIVITAMIN) LIQD Take 5 mLs by mouth daily.      . promethazine (PHENERGAN) 25 MG tablet Take 1 tablet (25 mg total) by mouth every 6 (six) hours as needed for nausea.  20 tablet  0   No current facility-administered medications on file prior to visit.    Review of Systems:  As per HPI, otherwise negative.    Physical Examination: Filed Vitals:   04/22/13 1627  BP: 152/90  Pulse: 60  Temp: 98.2 F (36.8 C)  Resp: 16   Filed Vitals:   04/22/13 1627  Height: 5' 5.5" (1.664 m)  Weight: 229 lb 12.8 oz (104.237 kg)   Body mass index is 37.65 kg/(m^2). Ideal Body Weight: Weight in (lb) to have BMI = 25: 152.2   GEN: WDWN, NAD, Non-toxic, Alert & Oriented x 3 HEENT: Atraumatic, Normocephalic.  Perioral dermatitis Ears and Nose: No external deformity. EXTR: No clubbing/cyanosis/edema NEURO: Normal gait.  PSYCH: Normally interactive. Conversant. Not depressed or anxious appearing.  Calm demeanor.  ABD:  Benign Right lower back tenderness. No CVA tenderness  Assessment and Plan: Lumbosacral strain Anaprox Flexeril   Signed,  Phillips Odor, MD

## 2013-04-22 NOTE — Patient Instructions (Signed)

## 2013-05-27 ENCOUNTER — Ambulatory Visit (INDEPENDENT_AMBULATORY_CARE_PROVIDER_SITE_OTHER): Payer: Federal, State, Local not specified - PPO | Admitting: Emergency Medicine

## 2013-05-27 VITALS — BP 138/94 | HR 72 | Temp 99.0°F | Resp 16 | Ht 64.5 in | Wt 231.0 lb

## 2013-05-27 DIAGNOSIS — R079 Chest pain, unspecified: Secondary | ICD-10-CM

## 2013-05-27 DIAGNOSIS — K219 Gastro-esophageal reflux disease without esophagitis: Secondary | ICD-10-CM

## 2013-05-27 DIAGNOSIS — R1032 Left lower quadrant pain: Secondary | ICD-10-CM

## 2013-05-27 LAB — POCT UA - MICROSCOPIC ONLY

## 2013-05-27 LAB — POCT URINALYSIS DIPSTICK
Bilirubin, UA: NEGATIVE
Glucose, UA: NEGATIVE
Ketones, UA: NEGATIVE
Spec Grav, UA: >=1.03

## 2013-05-27 LAB — POCT URINE PREGNANCY: Preg Test, Ur: NEGATIVE

## 2013-05-27 LAB — POCT CBC
Hemoglobin: 13 g/dL (ref 12.2–16.2)
MCH, POC: 26.8 pg — AB (ref 27–31.2)
MPV: 8.8 fL (ref 0–99.8)
POC Granulocyte: 4.1 (ref 2–6.9)
POC MID %: 5.1 %M (ref 0–12)
RBC: 4.85 M/uL (ref 4.04–5.48)
WBC: 6.7 10*3/uL (ref 4.6–10.2)

## 2013-05-27 MED ORDER — ESOMEPRAZOLE MAGNESIUM 40 MG PO CPDR: 40.0000 mg | DELAYED_RELEASE_CAPSULE | Freq: Every day | ORAL | Status: DC

## 2013-05-27 MED ORDER — SUCRALFATE 1 G PO TABS: ORAL_TABLET | ORAL | Status: DC

## 2013-05-27 NOTE — Progress Notes (Signed)
[facility] [address] [place] [address] [phone]  Date:  05/27/2013   Name:  Sharon [name]   DOB:  Feb 08, 1966   MRN:  [id number]  PCP:  Loreen Freud, DO    Chief Complaint: Abdominal Pain   History of Present Illness:  Sharon [name] is a 47 y.o. very pleasant female patient who presents with the following:  Pain in RLQ for the past three weeks.  No GI, GU, or GYN symptoms.  Started metamucil and that has improved somewhat.  No provocative factors.  Has pain in her left breast that radiates through to her back.  Says this comes on with eating.  Feels bloated.  Drinks little alcohol but lots of caffeine.  Non smoker.  No excess NSAID or ASA use.  No cardiac risk factors per patient although her record gives HBP history.  She also had a Right ovarian cyst 1 year ago and failed to have recommended follow up ultrasound examination.  Patient Active Problem List   Diagnosis Date Noted  . GERD 08/06/2009  . CHEST PAIN UNSPECIFIED 08/06/2009  . MORBID OBESITY 05/26/2008  . HYPERTENSION 02/19/2007    Past Medical History  Diagnosis Date  . Hypertension   . Seasonal allergies     Past Surgical History  Procedure Laterality Date  . Breast surgery      History  Substance Use Topics  . Smoking status: Never Smoker   . Smokeless tobacco: Not on file  . Alcohol Use: No    Family History  Problem Relation Age of Onset  . Diabetes Mother     Allergies  Allergen Reactions  . Codeine Nausea And Vomiting and Other (See Comments)    Light headedness    Medication list has been reviewed and updated.  Current Outpatient Prescriptions on File Prior to Visit  Medication Sig Dispense Refill  . acyclovir (ZOVIRAX) 400 MG tablet Take 1 tablet (400 mg total) by mouth 2 (two) times daily.  60 tablet  11  . lisinopril-hydrochlorothiazide (ZESTORETIC) 20-12.5 MG per tablet Take 1 tablet by mouth daily.  90 tablet  3   No current facility-administered  medications on file prior to visit.    Review of Systems:  As per HPI, otherwise negative.    Physical Examination: Filed Vitals:   05/27/13 1845  BP: 138/94  Pulse: 72  Temp: 99 F (37.2 C)  Resp: 16   Filed Vitals:   05/27/13 1845  Height: 5' 4.5" (1.638 m)  Weight: 231 lb (104.781 kg)   Body mass index is 39.05 kg/(m^2). Ideal Body Weight: Weight in (lb) to have BMI = 25: 147.6  GEN: WDWN, NAD, Non-toxic, A & O x 3 HEENT: Atraumatic, Normocephalic. Neck supple. No masses, No LAD. Ears and Nose: No external deformity. CV: RRR, No M/G/R. No JVD. No thrill. No extra heart sounds. PULM: CTA B, no wheezes, crackles, rhonchi. No retractions. No resp. distress. No accessory muscle use. ABD: S, RLQ tenderness, ND, +BS. No rebound. No HSM. EXTR: No c/c/e NEURO Normal gait.  PSYCH: Normally interactive. Conversant. Not depressed or anxious appearing.  Calm demeanor.    Assessment and Plan: RLQ abdominal pain CT Chest pain Likely gastric origin nexium carafate Follow up one monht Signed,  Phillips Odor, MD   Results for orders placed in visit on 05/27/13  POCT CBC      Result Value Range   WBC 6.7  4.6 - 10.2 K/uL   Lymph, poc  2.3  0.6 - 3.4   POC LYMPH PERCENT 34.1  10 - 50 %L   MID (cbc) 0.3  0 - 0.9   POC MID % 5.1  0 - 12 %M   POC Granulocyte 4.1  2 - 6.9   Granulocyte percent 60.8  37 - 80 %G   RBC 4.85  4.04 - 5.48 M/uL   Hemoglobin 13.0  12.2 - 16.2 g/dL   HCT, POC 16.1  09.6 - 47.9 %   MCV 88.2  80 - 97 fL   MCH, POC 26.8 (*) 27 - 31.2 pg   MCHC 30.4 (*) 31.8 - 35.4 g/dL   RDW, POC 04.5     Platelet Count, POC 244  142 - 424 K/uL   MPV 8.8  0 - 99.8 fL  POCT UA - MICROSCOPIC ONLY      Result Value Range   WBC, Ur, HPF, POC 2-3     RBC, urine, microscopic 4-5     Bacteria, U Microscopic 3+     Mucus, UA pos     Epithelial cells, urine per micros 7-8     Crystals, Ur, HPF, POC neg     Casts, Ur, LPF, POC neg     Yeast, UA neg    POCT  URINALYSIS DIPSTICK      Result Value Range   Color, UA yellow     Clarity, UA clear     Glucose, UA neg     Bilirubin, UA neg     Ketones, UA neg     Spec Grav, UA >=1.030     Blood, UA neg     pH, UA 5.5     Protein, UA neg     Urobilinogen, UA 0.2     Nitrite, UA neg     Leukocytes, UA Negative    POCT URINE PREGNANCY      Result Value Range   Preg Test, Ur Negative

## 2013-05-28 ENCOUNTER — Telehealth: Payer: Self-pay

## 2013-05-28 NOTE — Telephone Encounter (Signed)
Pt states that she was prescribed nexium yesterday and she is unable to afford it because it is $50. Pt would like to know if something cheaper could be called in or if it will be okay for her to take something over the counter. Best# [phone]

## 2013-05-28 NOTE — Telephone Encounter (Signed)
Patient needs cheaper medication. Nexium is to expensive. Please advise.

## 2013-05-28 NOTE — Telephone Encounter (Signed)
Tell her to get prilosec OTC and take TWO daily

## 2013-05-29 LAB — H. PYLORI ANTIBODY, IGG: H Pylori IgG: 6.65 {ISR} — ABNORMAL HIGH

## 2013-05-29 MED ORDER — AMOXICILL-CLARITHRO-LANSOPRAZ PO MISC: Freq: Two times a day (BID) | ORAL | Status: DC

## 2013-05-29 NOTE — Addendum Note (Signed)
Addended by: Carmelina Dane on: 05/29/2013 04:17 PM   Modules accepted: Orders

## 2013-06-05 ENCOUNTER — Ambulatory Visit
Admission: RE | Admit: 2013-06-05 | Discharge: 2013-06-05 | Disposition: A | Discharge status: Home / Self Care | Payer: Federal, State, Local not specified - PPO | Source: Ambulatory Visit | Attending: Emergency Medicine | Admitting: Emergency Medicine

## 2013-06-05 DIAGNOSIS — R1032 Left lower quadrant pain: Secondary | ICD-10-CM

## 2013-06-05 MED ORDER — IOHEXOL 300 MG/ML  SOLN
125.0000 mL | Freq: Once | INTRAMUSCULAR | Status: AC | PRN
  Administered 2013-06-05: 125 mL via INTRAVENOUS

## 2013-06-11 ENCOUNTER — Ambulatory Visit: Payer: Federal, State, Local not specified - PPO

## 2013-06-12 ENCOUNTER — Telehealth: Payer: Self-pay

## 2013-06-12 DIAGNOSIS — D219 Benign neoplasm of connective and other soft tissue, unspecified: Secondary | ICD-10-CM

## 2013-06-12 NOTE — Telephone Encounter (Signed)
Pt called wanting to know the results to her scan she had done last week. Please call  [phone]

## 2013-06-13 NOTE — Telephone Encounter (Signed)
IMPRESSION:  No evidence of bowel obstruction. Normal appendix.  Colonic diverticulosis, without convincing inflammatory changes to  suggest acute diverticulitis.  Suspected uterine fibroids.  2.6 cm right ovarian cyst / follicle, likely physiologic.   Please advise

## 2013-06-13 NOTE — Telephone Encounter (Signed)
Seems that I spoke to her directly.  She had a small ovarian cyst and that was all.  She should follow up with her GYN

## 2013-06-13 NOTE — Telephone Encounter (Signed)
Thanks she does not recall this. I called her to advise. She states she does not have one.

## 2013-09-07 ENCOUNTER — Ambulatory Visit (INDEPENDENT_AMBULATORY_CARE_PROVIDER_SITE_OTHER): Payer: Federal, State, Local not specified - PPO | Admitting: Family Medicine

## 2013-09-07 VITALS — BP 118/74 | HR 66 | Temp 98.1°F | Resp 18 | Ht 66.5 in | Wt 223.8 lb

## 2013-09-07 DIAGNOSIS — J040 Acute laryngitis: Secondary | ICD-10-CM

## 2013-09-07 MED ORDER — AMOXICILLIN 875 MG PO TABS: 875.0000 mg | ORAL_TABLET | Freq: Two times a day (BID) | ORAL | Status: DC

## 2013-09-07 MED ORDER — PREDNISONE 20 MG PO TABS: ORAL_TABLET | ORAL | Status: DC

## 2013-09-07 NOTE — Progress Notes (Signed)
This chart was scribed for Elvina Sidle, MD by Caryn Bee, Medical Scribe. This patient was seen in Room/bed 5 and the patient's care was started at 12:22 PM.  Subjective:    Patient ID: Sharon [name], female    DOB: 12-20-1965, 47 y.o.   MRN: [id number]  HPI HPI Comments: SADEY YANDELL is a 47 y.o. female who presents to [facility] complaining of gradual onset sinus pressure that began about 4 days ago. Pt complains of associated sore throat, cough, voice change, headache, ear pain, and chest pain. She reports worsened voice change that began yesterday. She has taken alkaseltzer cold with mild relief. She denies post tussive emesis. Pt is allergic to codeine.    Review of Systems  HENT: Positive for ear pain, sinus pressure, sore throat and voice change.   Respiratory: Positive for cough.   Cardiovascular: Positive for chest pain.  Gastrointestinal: Positive for vomiting.   Past Surgical History  Procedure Laterality Date  . Breast surgery     History   Social History  . Marital Status: Single    Spouse Name: N/A    Number of Children: N/A  . Years of Education: N/A   Occupational History  . Not on file.   Social History Main Topics  . Smoking status: Never Smoker   . Smokeless tobacco: Not on file  . Alcohol Use: No  . Drug Use: No  . Sexual Activity: Yes    Birth Control/ Protection: None   Other Topics Concern  . Not on file   Social History Narrative  . No narrative on file   Past Medical History  Diagnosis Date  . Hypertension   . Seasonal allergies    Family History  Problem Relation Age of Onset  . Diabetes Mother    Allergies  Allergen Reactions  . Codeine Nausea And Vomiting and Other (See Comments)    Light headedness        Objective:   Physical Exam  Nursing note and vitals reviewed. Constitutional: She is oriented to person, place, and time. She appears well-developed and well-nourished.  HENT:  Head: Atraumatic.  Right Ear: External  ear normal.  Left Ear: External ear normal.  Eyes: Conjunctivae and EOM are normal. Pupils are equal, round, and reactive to light.  Neck: Normal range of motion. Neck supple.  Cardiovascular: Normal rate, regular rhythm and normal heart sounds.  Exam reveals no gallop and no friction rub.   No murmur heard. Pulmonary/Chest: Effort normal and breath sounds normal. No respiratory distress. She has no wheezes. She has no rales. She exhibits no tenderness.  Neurological: She is alert and oriented to person, place, and time.  Skin: She is not diaphoretic.       Assessment & Plan:  Laryngitis - Plan: amoxicillin (AMOXIL) 875 MG tablet, predniSONE (DELTASONE) 20 MG tablet  Signed, Elvina Sidle, MD

## 2014-02-26 ENCOUNTER — Other Ambulatory Visit: Payer: Self-pay | Admitting: Family Medicine

## 2014-02-26 NOTE — Telephone Encounter (Signed)
Pt has been in a couple of times for other acute issues in past year, but not for HSV since 09/2012. Can we RF?

## 2014-03-12 ENCOUNTER — Ambulatory Visit (INDEPENDENT_AMBULATORY_CARE_PROVIDER_SITE_OTHER): Payer: Federal, State, Local not specified - PPO | Admitting: Family Medicine

## 2014-03-12 VITALS — BP 118/80 | HR 75 | Temp 98.7°F | Resp 16 | Ht 63.5 in | Wt 222.0 lb

## 2014-03-12 DIAGNOSIS — R350 Frequency of micturition: Secondary | ICD-10-CM

## 2014-03-12 DIAGNOSIS — K029 Dental caries, unspecified: Secondary | ICD-10-CM

## 2014-03-12 DIAGNOSIS — M545 Low back pain: Secondary | ICD-10-CM

## 2014-03-12 LAB — POCT URINALYSIS DIPSTICK
Bilirubin, UA: NEGATIVE
Glucose, UA: NEGATIVE
Leukocytes, UA: NEGATIVE
Nitrite, UA: NEGATIVE
PH UA: 6
RBC UA: NEGATIVE
UROBILINOGEN UA: 1

## 2014-03-12 LAB — POCT UA - MICROSCOPIC ONLY
CASTS, UR, LPF, POC: NEGATIVE
CRYSTALS, UR, HPF, POC: NEGATIVE
YEAST UA: NEGATIVE

## 2014-03-12 MED ORDER — AMOXICILLIN 500 MG PO CAPS: 1000.0000 mg | ORAL_CAPSULE | Freq: Two times a day (BID) | ORAL | Status: DC

## 2014-03-12 MED ORDER — TRAMADOL HCL 50 MG PO TABS: 50.0000 mg | ORAL_TABLET | Freq: Four times a day (QID) | ORAL | Status: DC | PRN

## 2014-03-12 NOTE — Patient Instructions (Signed)
1. Call your dentist tomorrow for an appointment. 2.  Increase your water intake.    Dental Pain A tooth ache may be caused by cavities (tooth decay). Cavities expose the nerve of the tooth to air and hot or cold temperatures. It may come from an infection or abscess (also called a boil or furuncle) around your tooth. It is also often caused by dental caries (tooth decay). This causes the pain you are having. DIAGNOSIS  Your caregiver can diagnose this problem by exam. TREATMENT   If caused by an infection, it may be treated with medications which kill germs (antibiotics) and pain medications as prescribed by your caregiver. Take medications as directed.  Only take over-the-counter or prescription medicines for pain, discomfort, or fever as directed by your caregiver.  Whether the tooth ache today is caused by infection or dental disease, you should see your dentist as soon as possible for further care. SEEK MEDICAL CARE IF: The exam and treatment you received today has been provided on an emergency basis only. This is not a substitute for complete medical or dental care. If your problem worsens or new problems (symptoms) appear, and you are unable to meet with your dentist, call or return to this location. SEEK IMMEDIATE MEDICAL CARE IF:   You have a fever.  You develop redness and swelling of your face, jaw, or neck.  You are unable to open your mouth.  You have severe pain uncontrolled by pain medicine. MAKE SURE YOU:   Understand these instructions.  Will watch your condition.  Will get help right away if you are not doing well or get worse. Document Released: 10/23/2005 Document Revised: 01/15/2012 Document Reviewed: 06/10/2008 [facility] Patient Information 2014 [place].

## 2014-03-12 NOTE — Progress Notes (Signed)
Subjective:    Patient ID: Sharon [name], female    DOB: 10/24/1966, 47 y.o.   MRN: [id number]  03/12/2014  Dental Pain and Urinary Frequency   HPI This chart was scribed for Wardell Honour, MD by Ladene Artist, ED Scribe. The patient was seen in room 5. Patient's care was started at 6:55 PM.  HPI Comments: Sharon [name] is a 48 y.o. female who [facility] complaining of lower L dental pain onset 1 week ago. Pt states that she has an abscess and was supposed to follow-up with a dentist a while ago but she never did. Pt reports associated swelling, nausea, subjective fever, HA and L ear pain. She has taken Tylenol, ibuprofen and applied ice with mild relief.  Pt also reports urinary frequency onset 2 weeks ago. She states that she drinks a lot of water and walks a lot at work. She reports associated urgency, mild dysuria and an odor. She denies hematuria, vaginal discharge, vaginal itching and vomiting. Pt used Monistat 7 with mild relief.  Pt also reports mild back pain. She attributes it to lifting heavy boxes at work.  Review of Systems  Constitutional: Positive for fever.  HENT: Positive for dental problem, ear pain and facial swelling.   Gastrointestinal: Positive for nausea. Negative for vomiting.  Genitourinary: Positive for dysuria, urgency and frequency. Negative for hematuria and vaginal discharge.  Musculoskeletal: Positive for back pain.  Neurological: Positive for headaches.    Past Medical History  Diagnosis Date  . Hypertension   . Seasonal allergies   . Allergy    Allergies  Allergen Reactions  . Codeine Nausea And Vomiting and Other (See Comments)    Light headedness   Current Outpatient Prescriptions  Medication Sig Dispense Refill  . docusate sodium (COLACE) 100 MG capsule Take 3 capsules (300 mg total) by mouth daily. 60 capsule 0  . lansoprazole (PREVACID) 30 MG capsule Take 1 capsule (30 mg total) by mouth daily at 12  noon. 90 capsule 0  . lisinopril-hydrochlorothiazide (PRINZIDE,ZESTORETIC) 20-25 MG per tablet Take 1 tablet by mouth daily. 90 tablet 1  . polyethylene glycol powder (GLYCOLAX/MIRALAX) powder Take 17 g by mouth daily. 527 g 0  . ranitidine (ZANTAC) 300 MG tablet Take 1 tablet (300 mg total) by mouth at bedtime. 30 tablet 0   No current facility-administered medications for this visit.   History   Social History  . Marital Status: Single    Spouse Name: N/A    Number of Children: N/A  . Years of Education: N/A   Occupational History  . Not on file.   Social History Main Topics  . Smoking status: Never Smoker   . Smokeless tobacco: Not on file  . Alcohol Use: No  . Drug Use: No  . Sexual Activity: Yes    Birth Control/ Protection: None   Other Topics Concern  . Not on file   Social History Narrative  . No narrative on file       Objective:    BP 118/80 mmHg  Pulse 75  Temp(Src) 98.7 F (37.1 C)  Resp 16  Ht 5' 3.5" (1.613 m)  Wt 222 lb (100.699 kg)  BMI 38.70 kg/m2  SpO2 98%  LMP 02/16/2014 Physical Exam  Constitutional: She is oriented to person, place, and time. She appears well-developed and well-nourished. No distress.  HENT:  Head: Normocephalic and atraumatic.  Right Ear: External ear normal.  Left Ear: External  ear normal.  Nose: Nose normal.  Mouth/Throat: Oropharynx is clear and moist. No oropharyngeal exudate.  No fluctuance  Tenderness along L lower gumline   Eyes: Conjunctivae and EOM are normal. Pupils are equal, round, and reactive to light.  Neck: Neck supple. No tracheal deviation present.  Cardiovascular: Normal rate, regular rhythm and normal heart sounds.   No murmur heard. Pulmonary/Chest: Effort normal. No respiratory distress.  Abdominal: Soft. Bowel sounds are normal. She exhibits no distension and no mass. There is no tenderness. There is no rebound, no guarding and no CVA tenderness.  Musculoskeletal: Normal range of motion.        Lumbar back: She exhibits pain. She exhibits normal range of motion, no tenderness, no bony tenderness and no spasm.  Neurological: She is alert and oriented to person, place, and time.  Skin: Skin is warm and dry.  Psychiatric: She has a normal mood and affect. Her behavior is normal.  Nursing note and vitals reviewed.  Results for orders placed or performed in visit on 03/12/14  Urine culture  Result Value Ref Range   Colony Count 65,000 COLONIES/ML    Organism ID, Bacteria Multiple bacterial morphotypes present, none    Organism ID, Bacteria predominant. Suggest appropriate recollection if     Organism ID, Bacteria clinically indicated.   POCT urinalysis dipstick  Result Value Ref Range   Color, UA yellow    Clarity, UA clear    Glucose, UA neg    Bilirubin, UA neg    Ketones, UA trace    Spec Grav, UA >=1.030    Blood, UA neg    pH, UA 6.0    Protein, UA trace    Urobilinogen, UA 1.0    Nitrite, UA neg    Leukocytes, UA Negative   POCT UA - Microscopic Only  Result Value Ref Range   WBC, Ur, HPF, POC 4-6    RBC, urine, microscopic 0-2    Bacteria, U Microscopic 1+    Mucus, UA 1+    Epithelial cells, urine per micros 2-4    Crystals, Ur, HPF, POC neg    Casts, Ur, LPF, POC neg    Yeast, UA neg        Assessment & Plan:   1. Urinary frequency   2. Pain due to dental caries   3. Low back pain without sciatica, unspecified back pain laterality     1. Dental pain/dental caries:  New. Rx for Amoxicillin and Tramadol provided; follow-up with dentist in upcoming 72 hours. 2. Urinary frequency: New. Send urine culture.  3. Low back pain/strain: New.  Recommend rest, stretching, frequent ambulation, Tramadol; avoid heavy lifting for two weeks.  If persists, RTC for lumbar spine films.   Meds ordered this encounter  Medications  . DISCONTD: amoxicillin (AMOXIL) 500 MG capsule    Sig: Take 2 capsules (1,000 mg total) by mouth 2 (two) times daily.    Dispense:  40  capsule    Refill:  0  . DISCONTD: traMADol (ULTRAM) 50 MG tablet    Sig: Take 1-2 tablets (50-100 mg total) by mouth every 6 (six) hours as needed.    Dispense:  30 tablet    Refill:  0    No Follow-up on file.    I personally performed the services described in this documentation, which was scribed in my presence.  The recorded information has been reviewed and is accurate.  Reginia Forts, M.D.  [facility] [address] [place], [place]  [id number] [phone] phone [phone] fax

## 2014-03-14 LAB — URINE CULTURE

## 2014-06-21 ENCOUNTER — Ambulatory Visit (INDEPENDENT_AMBULATORY_CARE_PROVIDER_SITE_OTHER): Payer: Federal, State, Local not specified - PPO | Admitting: Physician Assistant

## 2014-06-21 VITALS — BP 142/90 | HR 70 | Temp 98.4°F | Resp 18 | Ht 65.0 in | Wt 225.6 lb

## 2014-06-21 DIAGNOSIS — M25559 Pain in unspecified hip: Secondary | ICD-10-CM

## 2014-06-21 DIAGNOSIS — M25551 Pain in right hip: Secondary | ICD-10-CM

## 2014-06-21 DIAGNOSIS — K5900 Constipation, unspecified: Secondary | ICD-10-CM

## 2014-06-21 DIAGNOSIS — K5790 Diverticulosis of intestine, part unspecified, without perforation or abscess without bleeding: Secondary | ICD-10-CM | POA: Insufficient documentation

## 2014-06-21 DIAGNOSIS — D259 Leiomyoma of uterus, unspecified: Secondary | ICD-10-CM

## 2014-06-21 DIAGNOSIS — I10 Essential (primary) hypertension: Secondary | ICD-10-CM

## 2014-06-21 DIAGNOSIS — R1013 Epigastric pain: Secondary | ICD-10-CM

## 2014-06-21 DIAGNOSIS — N898 Other specified noninflammatory disorders of vagina: Secondary | ICD-10-CM

## 2014-06-21 LAB — POCT URINALYSIS DIPSTICK
Bilirubin, UA: NEGATIVE
Glucose, UA: NEGATIVE
Leukocytes, UA: NEGATIVE
Nitrite, UA: NEGATIVE
PH UA: 6
PROTEIN UA: NEGATIVE
RBC UA: NEGATIVE
SPEC GRAV UA: 1.025
UROBILINOGEN UA: 0.2

## 2014-06-21 LAB — POCT CBC
GRANULOCYTE PERCENT: 59.2 % (ref 37–80)
HCT, POC: 40.7 % (ref 37.7–47.9)
Hemoglobin: 12.9 g/dL (ref 12.2–16.2)
Lymph, poc: 2.2 (ref 0.6–3.4)
MCH, POC: 27.1 pg (ref 27–31.2)
MCHC: 31.6 g/dL — AB (ref 31.8–35.4)
MCV: 85.6 fL (ref 80–97)
MID (CBC): 0.3 (ref 0–0.9)
MPV: 7.6 fL (ref 0–99.8)
PLATELET COUNT, POC: 226 10*3/uL (ref 142–424)
POC GRANULOCYTE: 3.6 (ref 2–6.9)
POC LYMPH %: 36.7 % (ref 10–50)
POC MID %: 4.1 % (ref 0–12)
RBC: 4.75 M/uL (ref 4.04–5.48)
RDW, POC: 16.8 %
WBC: 6.1 10*3/uL (ref 4.6–10.2)

## 2014-06-21 LAB — POCT UA - MICROSCOPIC ONLY
CASTS, UR, LPF, POC: NEGATIVE
CRYSTALS, UR, HPF, POC: NEGATIVE
Mucus, UA: NEGATIVE
WBC, Ur, HPF, POC: NEGATIVE
Yeast, UA: NEGATIVE

## 2014-06-21 LAB — POCT WET PREP WITH KOH
KOH Prep POC: NEGATIVE
TRICHOMONAS UA: NEGATIVE
YEAST WET PREP PER HPF POC: NEGATIVE

## 2014-06-21 MED ORDER — LISINOPRIL-HYDROCHLOROTHIAZIDE 20-25 MG PO TABS: 1.0000 | ORAL_TABLET | Freq: Every day | ORAL | Status: DC

## 2014-06-21 MED ORDER — LANSOPRAZOLE 30 MG PO CPDR: 30.0000 mg | DELAYED_RELEASE_CAPSULE | Freq: Every day | ORAL | Status: DC

## 2014-06-21 MED ORDER — POLYETHYLENE GLYCOL 3350 17 GM/SCOOP PO POWD: 17.0000 g | Freq: Every day | ORAL | Status: DC

## 2014-06-21 MED ORDER — RANITIDINE HCL 300 MG PO TABS: 300.0000 mg | ORAL_TABLET | Freq: Every day | ORAL | Status: DC

## 2014-06-21 MED ORDER — DOCUSATE SODIUM 100 MG PO CAPS: 300.0000 mg | ORAL_CAPSULE | Freq: Every day | ORAL | Status: DC

## 2014-06-21 MED ORDER — METRONIDAZOLE 500 MG PO TABS: 500.0000 mg | ORAL_TABLET | Freq: Two times a day (BID) | ORAL | Status: AC

## 2014-06-21 NOTE — Patient Instructions (Signed)
Miralax is to help stop your constipation which may be making your abd pain worse.   The Colace will help soften your stool.  Prevacid and Zantac are to help with heartburn  Flagyl is to treat a possible bacterial vaginal infection  I will contact you with your lab results as soon as they are available.   If you have not heard from me in 2 weeks, please contact me.  The fastest way to get your results is to register for My Chart (see the instructions on the last page of this printout).

## 2014-06-21 NOTE — Progress Notes (Signed)
Subjective:    Patient ID: Sharon [name], female    DOB: [birth year], 48 y.o.   MRN: [id number]  HPI Pt presents to clinic with return of her abdominal pain over the last 2 months.  This pain feels the same to last year when she tested positive for H Pylori - she took a prevpac and she felt a lot better until about 2 months ago - pain seems worse after she eat and with intercourse.  She has a new partner and is having vaginal discharge.  She had a heavy menses 2 months ago but she is starting to have irregular painful menses.  She has know fibroids but has not done anything for them.  She is having no urinary symptoms.  She did have some lower right sided back pain this am but her abd pain is much worse.  She has tried nothing to help with the pain.  She has also been having some constipation but it does not seem related to her pain.  She is having no fevers or chills.  Review of Systems  Constitutional: Negative for fever and chills.  HENT: Negative.   Eyes: Negative.   Respiratory: Negative.   Cardiovascular: Negative.   Gastrointestinal: Positive for abdominal pain and constipation. Negative for nausea, vomiting and diarrhea.  Endocrine: Negative.   Genitourinary: Positive for vaginal discharge. Negative for dysuria and hematuria.  Allergic/Immunologic: Negative.   Neurological: Negative.   Hematological: Negative.   Psychiatric/Behavioral: Negative.        Objective:   Physical Exam  Vitals reviewed. Constitutional: She is oriented to person, place, and time. She appears well-developed and well-nourished.  HENT:  Head: Normocephalic and atraumatic.  Right Ear: External ear normal.  Left Ear: External ear normal.  Cardiovascular: Normal rate, regular rhythm and normal heart sounds.   No murmur heard. Pulmonary/Chest: Effort normal and breath sounds normal. She has no wheezes.  Abdominal: Soft. Bowel sounds are normal. There is tenderness in the right lower quadrant and  epigastric area. There is no rebound, no guarding and no CVA tenderness.  Genitourinary: Uterus normal. There is no rash, tenderness, lesion or injury on the right labia. There is no rash, tenderness, lesion or injury on the left labia. Cervix exhibits no motion tenderness, no discharge and no friability. Right adnexum displays no mass, no tenderness and no fullness. Left adnexum displays no mass, no tenderness and no fullness. No erythema or tenderness around the vagina. Vaginal discharge (thin white d/c) found.  Neurological: She is alert and oriented to person, place, and time.  Skin: Skin is warm and dry.  Psychiatric: She has a normal mood and affect. Her behavior is normal. Judgment and thought content normal.       Assessment & Plan:  Abdominal pain, epigastric - We are going to treat for reflux and if this does not help we will retreat for H pylori and if she is not better we will refer to GI for evaluate with endoscopy.  Plan: POCT CBC, POCT urinalysis dipstick, ranitidine (ZANTAC) 300 MG tablet, lansoprazole (PREVACID) 30 MG capsule  Essential hypertension - We will get her restarted on her appropriate medication dose and she will monitor her BP at home.  Plan: COMPLETE METABOLIC PANEL WITH GFR, POCT urinalysis dipstick, lisinopril-hydrochlorothiazide (PRINZIDE,ZESTORETIC) 20-25 MG per tablet  Vaginal discharge - We will wait for labs to return and treat for mild BV at this time.  Plan: POCT Wet Prep with KOH, GC/Chlamydia Probe Amp, POCT urinalysis dipstick,  metroNIDAZOLE (FLAGYL) 500 MG tablet  Pain in joint, pelvic region and thigh, right - Plan: POCT UA - Microscopic Only, POCT urinalysis dipstick  Uterine leiomyoma, unspecified location - in 2013 they were not visible but in 2104 2 cm fibroids and if her pain continues we may need to repeat her [place] to determine how large they are at this point -   Unspecified constipation - This may be related to her pain so she will increase her  water and fiber intake and use these meds to help with her symptoms.  Plan: polyethylene glycol powder (GLYCOLAX/MIRALAX) powder, docusate sodium (COLACE) 100 MG capsule  Windell Hummingbird PA-C  [facility] 06/21/2014 5:52 PM

## 2014-06-22 LAB — COMPLETE METABOLIC PANEL WITH GFR
ALBUMIN: 4.1 g/dL (ref 3.5–5.2)
ALK PHOS: 46 U/L (ref 39–117)
ALT: 10 U/L (ref 0–35)
AST: 14 U/L (ref 0–37)
BUN: 14 mg/dL (ref 6–23)
CALCIUM: 9.5 mg/dL (ref 8.4–10.5)
CHLORIDE: 102 meq/L (ref 96–112)
CO2: 28 mEq/L (ref 19–32)
CREATININE: 1.13 mg/dL — AB (ref 0.50–1.10)
GFR, Est African American: 65 mL/min
GFR, Est Non African American: 57 mL/min — ABNORMAL LOW
Glucose, Bld: 91 mg/dL (ref 70–99)
POTASSIUM: 4.4 meq/L (ref 3.5–5.3)
Sodium: 139 mEq/L (ref 135–145)
Total Bilirubin: 0.4 mg/dL (ref 0.2–1.2)
Total Protein: 6.7 g/dL (ref 6.0–8.3)

## 2014-06-23 LAB — GC/CHLAMYDIA PROBE AMP
CT PROBE, AMP APTIMA: NEGATIVE
GC Probe RNA: NEGATIVE

## 2015-01-05 ENCOUNTER — Other Ambulatory Visit: Payer: Self-pay | Admitting: Physician Assistant

## 2015-02-03 ENCOUNTER — Other Ambulatory Visit: Payer: Self-pay | Admitting: Physician Assistant

## 2015-02-28 ENCOUNTER — Other Ambulatory Visit: Payer: Self-pay | Admitting: Physician Assistant

## 2015-03-02 NOTE — Telephone Encounter (Signed)
Called pt to advise of need for f/up. She agreed to come to see Judson Roch next Thurs evening. I sent in 2 more weeks RF to cover her until then.

## 2015-03-04 ENCOUNTER — Ambulatory Visit (INDEPENDENT_AMBULATORY_CARE_PROVIDER_SITE_OTHER): Payer: Federal, State, Local not specified - PPO | Admitting: Physician Assistant

## 2015-03-04 VITALS — BP 142/88 | HR 65 | Temp 98.4°F | Resp 20 | Ht 65.0 in | Wt 225.1 lb

## 2015-03-04 DIAGNOSIS — R1013 Epigastric pain: Secondary | ICD-10-CM | POA: Diagnosis not present

## 2015-03-04 DIAGNOSIS — Z8619 Personal history of other infectious and parasitic diseases: Secondary | ICD-10-CM | POA: Diagnosis not present

## 2015-03-04 LAB — POCT CBC
Granulocyte percent: 53.6 %G (ref 37–80)
HEMATOCRIT: 41.9 % (ref 37.7–47.9)
HEMOGLOBIN: 13.4 g/dL (ref 12.2–16.2)
LYMPH, POC: 2.5 (ref 0.6–3.4)
MCH: 26.7 pg — AB (ref 27–31.2)
MCHC: 32.1 g/dL (ref 31.8–35.4)
MCV: 83.2 fL (ref 80–97)
MID (cbc): 0.1 (ref 0–0.9)
MPV: 7.1 fL (ref 0–99.8)
POC Granulocyte: 2.9 (ref 2–6.9)
POC LYMPH PERCENT: 44.7 %L (ref 10–50)
POC MID %: 1.7 %M (ref 0–12)
Platelet Count, POC: 262 10*3/uL (ref 142–424)
RBC: 5.03 M/uL (ref 4.04–5.48)
RDW, POC: 14.8 %
WBC: 5.5 10*3/uL (ref 4.6–10.2)

## 2015-03-04 MED ORDER — LANSOPRAZOLE 30 MG PO CPDR: 30.0000 mg | DELAYED_RELEASE_CAPSULE | Freq: Every day | ORAL | Status: DC

## 2015-03-04 MED ORDER — RANITIDINE HCL 300 MG PO TABS: 300.0000 mg | ORAL_TABLET | Freq: Every day | ORAL | Status: DC

## 2015-03-04 NOTE — Progress Notes (Signed)
Subjective:    Patient ID: Sharon [name], female    DOB: 11-Aug-1966, 49 y.o.   MRN: [id number]  HPI Pt presents to clinic with epigastric pain and discomfort.  It feels like it did in 2014 when she had H pylori and she took the medication and she has felt fine since.  She took a left over dose from then today and she states that she felt a little better afterwards.  Her pain in epigastric in location and feels like a burning in that area that moves up her chest.  It seems worse with food.  She does not have a cramping sensation.  She always has a bitter taste in her mouth that seems to be worse in the am.  This has been going on several weeks.  She wonders if it is related to any of her cysts that have been found on imaging studies in the past.    Review of Systems  Constitutional: Negative for fever and chills.  Gastrointestinal: Positive for nausea, vomiting (when the pain is reallt bad - the vomit is very bitter and tastes like acid and is yellow in color) and abdominal pain. Negative for diarrhea.  Genitourinary: Positive for menstrual problem (very irregular).    Patient Active Problem List   Diagnosis Date Noted  . Diverticulosis 06/21/2014  . Fibroid, uterine 06/21/2014  . GERD 08/06/2009  . CHEST PAIN UNSPECIFIED 08/06/2009  . MORBID OBESITY 05/26/2008  . HYPERTENSION 02/19/2007   Prior to Admission medications   Medication Sig Start Date End Date Taking? Authorizing Provider  docusate sodium (COLACE) 100 MG capsule Take 3 capsules (300 mg total) by mouth daily. 06/21/14  Yes Mancel Bale, PA-C  lansoprazole (PREVACID) 30 MG capsule Take 1 capsule (30 mg total) by mouth daily at 12 noon. 06/21/14  Yes Mancel Bale, PA-C  lisinopril-hydrochlorothiazide (PRINZIDE,ZESTORETIC) 20-25 MG per tablet Take 1 tablet by mouth daily. NO MORE REFILLS WITHOUT OFFICE VISIT - 2ND NOTICE 02/04/15  Yes Dorian Heckle English, PA  ranitidine (ZANTAC) 300 MG tablet Take 1 tablet (300 mg total) by mouth  at bedtime. 06/21/14  Yes Mancel Bale, PA-C   Allergies  Allergen Reactions  . Codeine Nausea And Vomiting and Other (See Comments)    Light headedness    Medications, allergies, past medical history, surgical history, family history, social history and problem list reviewed and updated.      Objective:   Physical Exam  Constitutional: She appears well-developed and well-nourished.  BP 142/88 mmHg  Pulse 65  Temp(Src) 98.4 F (36.9 C) (Oral)  Resp 20  Ht 5\' 5"  (1.651 m)  Wt 225 lb 2 oz (102.116 kg)  BMI 37.46 kg/m2  SpO2 98%  LMP 03/01/2015   HENT:  Head: Normocephalic and atraumatic.  Right Ear: External ear normal.  Left Ear: External ear normal.  Eyes: Left eye exhibits no discharge.  Cardiovascular: Normal rate, regular rhythm and normal heart sounds.   No murmur heard. Pulmonary/Chest: Effort normal and breath sounds normal.  Abdominal: Soft. Bowel sounds are normal. There is tenderness in the epigastric area. There is no rebound and negative Murphy's sign.     Results for orders placed or performed in visit on 03/04/15  POCT CBC  Result Value Ref Range   WBC 5.5 4.6 - 10.2 K/uL   Lymph, poc 2.5 0.6 - 3.4   POC LYMPH PERCENT 44.7 10 - 50 %L   MID (cbc) 0.1 0 - 0.9   POC  MID % 1.7 0 - 12 %M   POC Granulocyte 2.9 2 - 6.9   Granulocyte percent 53.6 37 - 80 %G   RBC 5.03 4.04 - 5.48 M/uL   Hemoglobin 13.4 12.2 - 16.2 g/dL   HCT, POC 41.9 37.7 - 47.9 %   MCV 83.2 80 - 97 fL   MCH, POC 26.7 (A) 27 - 31.2 pg   MCHC 32.1 31.8 - 35.4 g/dL   RDW, POC 14.8 %   Platelet Count, POC 262 142 - 424 K/uL   MPV 7.1 0 - 99.8 fL       Assessment & Plan:  Abdominal pain, epigastric - Plan: POCT CBC, H. pylori breath test, COMPLETE METABOLIC PANEL WITH GFR, Lipase, ranitidine (ZANTAC) 300 MG tablet, lansoprazole (PREVACID) 30 MG capsule  History of Helicobacter pylori infection  Due to her history of H pylori we are going to restart her PPI and Zantac for her comfort  while we are waiting on her results and treat depending on her labs.  She has a colonoscopy scheduled for the end of May and if she has not improved we will request that she has an endoscopy at the same time.  We will be in contact after her labs.  We reviewed her imaging studies which showed right ovarian cyst and possible uterine fibroids and we discussed how those should not be related to her current symptoms.  Her questions were answered and she agreed with the plan.  Windell Hummingbird PA-C  [facility] 03/04/2015 8:57 PM

## 2015-03-04 NOTE — Patient Instructions (Addendum)
We are going to start you on medications to help with heartburn while we wait for the results of the test for bacteria in your stomach.  Depending on those results if they are positive then we will have to treat you with antibiotics again.  If they are neg and you are still having the same problem in about 2 weeks call me so we can get the person doing your colonoscopy to also look into your stomach at the same time.    I will contact you with your lab results as soon as they are available.   If you have not heard from me in 2 weeks, please contact me.  The fastest way to get your results is to register for My Chart (see the instructions on the last page of this printout).

## 2015-03-05 LAB — COMPLETE METABOLIC PANEL WITH GFR
ALBUMIN: 4.1 g/dL (ref 3.5–5.2)
ALT: 13 U/L (ref 0–35)
AST: 16 U/L (ref 0–37)
Alkaline Phosphatase: 58 U/L (ref 39–117)
BUN: 15 mg/dL (ref 6–23)
CHLORIDE: 105 meq/L (ref 96–112)
CO2: 25 mEq/L (ref 19–32)
Calcium: 9.2 mg/dL (ref 8.4–10.5)
Creat: 0.99 mg/dL (ref 0.50–1.10)
GFR, EST AFRICAN AMERICAN: 77 mL/min
GFR, Est Non African American: 67 mL/min
Glucose, Bld: 93 mg/dL (ref 70–99)
POTASSIUM: 4.1 meq/L (ref 3.5–5.3)
Sodium: 140 mEq/L (ref 135–145)
Total Bilirubin: 0.3 mg/dL (ref 0.2–1.2)
Total Protein: 6.6 g/dL (ref 6.0–8.3)

## 2015-03-05 LAB — LIPASE: LIPASE: 20 U/L (ref 0–75)

## 2015-03-08 LAB — H. PYLORI BREATH TEST: H. PYLORI BREATH TEST: NOT DETECTED

## 2015-05-27 ENCOUNTER — Other Ambulatory Visit: Payer: Self-pay | Admitting: Physician Assistant

## 2016-01-09 ENCOUNTER — Ambulatory Visit (INDEPENDENT_AMBULATORY_CARE_PROVIDER_SITE_OTHER): Payer: Federal, State, Local not specified - PPO | Admitting: Physician Assistant

## 2016-01-09 VITALS — BP 152/68 | HR 87 | Temp 100.4°F | Resp 16 | Ht 65.0 in | Wt 227.0 lb

## 2016-01-09 DIAGNOSIS — R6889 Other general symptoms and signs: Secondary | ICD-10-CM

## 2016-01-09 DIAGNOSIS — R509 Fever, unspecified: Secondary | ICD-10-CM | POA: Diagnosis not present

## 2016-01-09 DIAGNOSIS — J101 Influenza due to other identified influenza virus with other respiratory manifestations: Secondary | ICD-10-CM

## 2016-01-09 LAB — POCT INFLUENZA A/B
INFLUENZA A, POC: POSITIVE — AB
INFLUENZA B, POC: NEGATIVE

## 2016-01-09 MED ORDER — IBUPROFEN 200 MG PO TABS
400.0000 mg | ORAL_TABLET | Freq: Once | ORAL | Status: AC
  Administered 2016-01-09: 400 mg via ORAL

## 2016-01-09 MED ORDER — GUAIFENESIN ER 1200 MG PO TB12: 1.0000 | ORAL_TABLET | Freq: Two times a day (BID) | ORAL | Status: DC | PRN

## 2016-01-09 MED ORDER — OSELTAMIVIR PHOSPHATE 75 MG PO CAPS: 75.0000 mg | ORAL_CAPSULE | Freq: Two times a day (BID) | ORAL | Status: DC

## 2016-01-09 NOTE — Patient Instructions (Signed)
Influenza, Adult Influenza ("the flu") is a viral infection of the respiratory tract. It occurs more often in winter months because people spend more time in close contact with one another. Influenza can make you feel very sick. Influenza easily spreads from person to person (contagious). CAUSES  Influenza is caused by a virus that infects the respiratory tract. You can catch the virus by breathing in droplets from an infected person's cough or sneeze. You can also catch the virus by touching something that was recently contaminated with the virus and then touching your mouth, nose, or eyes. RISKS AND COMPLICATIONS You may be at risk for a more severe case of influenza if you smoke cigarettes, have diabetes, have chronic heart disease (such as heart failure) or lung disease (such as asthma), or if you have a weakened immune system. Elderly people and pregnant women are also at risk for more serious infections. The most common problem of influenza is a lung infection (pneumonia). Sometimes, this problem can require emergency medical care and may be life threatening. SIGNS AND SYMPTOMS  Symptoms typically last 4 to 10 days and may include:  Fever.  Chills.  Headache, body aches, and muscle aches.  Sore throat.  Chest discomfort and cough.  Poor appetite.  Weakness or feeling tired.  Dizziness.  Nausea or vomiting. DIAGNOSIS  Diagnosis of influenza is often made based on your history and a physical exam. A nose or throat swab test can be done to confirm the diagnosis. TREATMENT  In mild cases, influenza goes away on its own. Treatment is directed at relieving symptoms. For more severe cases, your health care provider may prescribe antiviral medicines to shorten the sickness. Antibiotic medicines are not effective because the infection is caused by a virus, not by bacteria. HOME CARE INSTRUCTIONS  Take medicines only as directed by your health care provider.  Use a cool mist humidifier  to make breathing easier.  Get plenty of rest until your temperature returns to normal. This usually takes 3 to 4 days.  Drink enough fluid to keep your urine clear or pale yellow.  Cover yourmouth and nosewhen coughing or sneezing,and wash your handswellto prevent thevirusfrom spreading.  Stay homefromwork orschool untilthe fever is gonefor at least 48full day. PREVENTION  An annual influenza vaccination (flu shot) is the best way to avoid getting influenza. An annual flu shot is now routinely recommended for all adults in the [place] IF:  You experiencechest pain, yourcough worsens,or you producemore mucus.  Youhave nausea,vomiting, ordiarrhea.  Your fever returns or gets worse. SEEK IMMEDIATE MEDICAL CARE IF:  You havetrouble breathing, you become short of breath,or your skin ornails becomebluish.  You have severe painor stiffnessin the neck.  You develop a sudden headache, or pain in the face or ear.  You have nausea or vomiting that you cannot control. MAKE SURE YOU:   Understand these instructions.  Will watch your condition.  Will get help right away if you are not doing well or get worse.   This information is not intended to replace advice given to you by your health care provider. Make sure you discuss any questions you have with your health care provider.   Document Released: 10/20/2000 Document Revised: 11/13/2014 Document Reviewed: 01/22/2012 Elsevier Interactive Patient Education 2016 Elsevier Inc.   Oseltamivir capsules What is this medicine? OSELTAMIVIR (os el TAM i vir) is an antiviral medicine. It is used to prevent and to treat some kinds of influenza or the flu. It will  not work for colds or other viral infections. This medicine may be used for other purposes; ask your health care provider or pharmacist if you have questions. What should I tell my health care provider before I take this medicine? They need to  know if you have any of the following conditions: -heart disease -immune system problems -kidney disease -liver disease -lung disease -an unusual or allergic reaction to oseltamivir, other medicines, foods, dyes, or preservatives -pregnant or trying to get pregnant -breast-feeding How should I use this medicine? Take this medicine by mouth with a glass of water. Follow the directions on the prescription label. Start this medicine at the first sign of flu symptoms. You can take it with or without food. If it upsets your stomach, take it with food. Take your medicine at regular intervals. Do not take your medicine more often than directed. Take all of your medicine as directed even if you think you are better. Do not skip doses or stop your medicine early. Talk to your pediatrician regarding the use of this medicine in children. While this drug may be prescribed for children as young as 14 days for selected conditions, precautions do apply. Overdosage: If you think you have taken too much of this medicine contact a poison control center or emergency room at once. NOTE: This medicine is only for you. Do not share this medicine with others. What if I miss a dose? If you miss a dose, take it as soon as you remember. If it is almost time for your next dose (within 2 hours), take only that dose. Do not take double or extra doses. What may interact with this medicine? Interactions are not expected. This list may not describe all possible interactions. Give your health care provider a list of all the medicines, herbs, non-prescription drugs, or dietary supplements you use. Also tell them if you smoke, drink alcohol, or use illegal drugs. Some items may interact with your medicine. What should I watch for while using this medicine? Visit your doctor or health care professional for regular check ups. Tell your doctor if your symptoms do not start to get better or if they get worse. If you have the flu, you  may be at an increased risk of developing seizures, confusion, or abnormal behavior. This occurs early in the illness, and more frequently in children and teens. These events are not common, but may result in accidental injury to the patient. Families and caregivers of patients should watch for signs of unusual behavior and contact a doctor or health care professional right away if the patient shows signs of unusual behavior. This medicine is not a substitute for the flu shot. Talk to your doctor each year about an annual flu shot. What side effects may I notice from receiving this medicine? Side effects that you should report to your doctor or health care professional as soon as possible: -allergic reactions like skin rash, itching or hives, swelling of the face, lips, or tongue -anxiety, confusion, unusual behavior -breathing problems -hallucination, loss of contact with reality -redness, blistering, peeling or loosening of the skin, including inside the mouth -seizures Side effects that usually do not require medical attention (report to your doctor or health care professional if they continue or are bothersome): -diarrhea -headache -nausea, vomiting -pain This list may not describe all possible side effects. Call your doctor for medical advice about side effects. You may report side effects to FDA at 1-800-FDA-1088. Where should I keep my medicine? Keep  out of the reach of children. Store at room temperature between 15 and 30 degrees C (59 and 86 degrees F). Throw away any unused medicine after the expiration date. NOTE: This sheet is a summary. It may not cover all possible information. If you have questions about this medicine, talk to your doctor, pharmacist, or health care provider.    2016, Elsevier/Gold Standard. (2015-04-28 10:50:39)

## 2016-01-28 ENCOUNTER — Ambulatory Visit (INDEPENDENT_AMBULATORY_CARE_PROVIDER_SITE_OTHER): Payer: Federal, State, Local not specified - PPO | Admitting: Family Medicine

## 2016-01-28 VITALS — BP 120/80 | HR 62 | Temp 98.6°F | Resp 18 | Ht 65.0 in | Wt 232.2 lb

## 2016-01-28 DIAGNOSIS — J209 Acute bronchitis, unspecified: Secondary | ICD-10-CM

## 2016-01-28 LAB — POCT CBC
Granulocyte percent: 55 %G (ref 37–80)
HEMATOCRIT: 37.7 % (ref 37.7–47.9)
Hemoglobin: 13 g/dL (ref 12.2–16.2)
LYMPH, POC: 2.5 (ref 0.6–3.4)
MCH, POC: 28 pg (ref 27–31.2)
MCHC: 34.4 g/dL (ref 31.8–35.4)
MCV: 81.5 fL (ref 80–97)
MID (CBC): 0.3 (ref 0–0.9)
MPV: 7 fL (ref 0–99.8)
POC GRANULOCYTE: 3.5 (ref 2–6.9)
POC LYMPH %: 40.1 % (ref 10–50)
POC MID %: 4.9 % (ref 0–12)
Platelet Count, POC: 340 10*3/uL (ref 142–424)
RBC: 4.63 M/uL (ref 4.04–5.48)
RDW, POC: 15.6 %
WBC: 6.3 10*3/uL (ref 4.6–10.2)

## 2016-01-28 MED ORDER — BENZONATATE 200 MG PO CAPS: 200.0000 mg | ORAL_CAPSULE | Freq: Three times a day (TID) | ORAL | Status: DC | PRN

## 2016-01-28 MED ORDER — PROMETHAZINE-DM 6.25-15 MG/5ML PO SYRP: 5.0000 mL | ORAL_SOLUTION | Freq: Four times a day (QID) | ORAL | Status: DC | PRN

## 2016-01-28 MED ORDER — ALBUTEROL SULFATE 108 (90 BASE) MCG/ACT IN AEPB: 2.0000 | INHALATION_SPRAY | RESPIRATORY_TRACT | Status: DC | PRN

## 2016-01-28 MED ORDER — METHYLPREDNISOLONE ACETATE 80 MG/ML IJ SUSP
80.0000 mg | Freq: Once | INTRAMUSCULAR | Status: AC
  Administered 2016-01-28: 80 mg via INTRAMUSCULAR

## 2016-01-28 NOTE — Patient Instructions (Addendum)
IF you received an x-ray today, you will receive an invoice from [facility]. Please contact [facility] at [phone] with questions or concerns regarding your invoice.   IF you received labwork today, you will receive an invoice from [employer]. Please contact [facility] at [phone] with questions or concerns regarding your invoice.   Our billing staff will not be able to assist you with questions regarding bills from these companies.  You will be contacted with the lab results as soon as they are available. The fastest way to get your results is to activate your My Chart account. Instructions are located on the last page of this paperwork. If you have not heard from [place] regarding the results in 2 weeks, please contact this office.   Use the tessalon during the day - it is non-drowsy.  Try the promethazine cough syrup at night for cough.  You will probably be coughing for the next 2-3 weeks.  However, you shouldn't feel bad - if you are feeling worse at all, come back immediately.   Acute Bronchitis Bronchitis is inflammation of the airways that extend from the windpipe into the lungs (bronchi). The inflammation often causes mucus to develop. This leads to a cough, which is the most common symptom of bronchitis.  In acute bronchitis, the condition usually develops suddenly and goes away over time, usually in a couple weeks. Smoking, allergies, and asthma can make bronchitis worse. Repeated episodes of bronchitis may cause further lung problems.  CAUSES Acute bronchitis is most often caused by the same virus that causes a cold. The virus can spread from person to person (contagious) through coughing, sneezing, and touching contaminated objects. SIGNS AND SYMPTOMS   Cough.   Fever.   Coughing up mucus.   Body aches.   Chest congestion.   Chills.   Shortness of breath.   Sore throat.  DIAGNOSIS  Acute bronchitis is  usually diagnosed through a physical exam. Your health care provider will also ask you questions about your medical history. Tests, such as chest X-rays, are sometimes done to rule out other conditions.  TREATMENT  Acute bronchitis usually goes away in a couple weeks. Oftentimes, no medical treatment is necessary. Medicines are sometimes given for relief of fever or cough. Antibiotic medicines are usually not needed but may be prescribed in certain situations. In some cases, an inhaler may be recommended to help reduce shortness of breath and control the cough. A cool mist vaporizer may also be used to help thin bronchial secretions and make it easier to clear the chest.  HOME CARE INSTRUCTIONS  Get plenty of rest.   Drink enough fluids to keep your urine clear or pale yellow (unless you have a medical condition that requires fluid restriction). Increasing fluids may help thin your respiratory secretions (sputum) and reduce chest congestion, and it will prevent dehydration.   Take medicines only as directed by your health care provider.  If you were prescribed an antibiotic medicine, finish it all even if you start to feel better.  Avoid smoking and secondhand smoke. Exposure to cigarette smoke or irritating chemicals will make bronchitis worse. If you are a smoker, consider using nicotine gum or skin patches to help control withdrawal symptoms. Quitting smoking will help your lungs heal faster.   Reduce the chances of another bout of acute bronchitis by washing your hands frequently, avoiding people with cold symptoms, and trying not to touch your hands to your mouth, nose, or eyes.  Keep all follow-up visits as directed by your health care provider.  SEEK MEDICAL CARE IF: Your symptoms do not improve after 1 week of treatment.  SEEK IMMEDIATE MEDICAL CARE IF:  You develop an increased fever or chills.   You have chest pain.   You have severe shortness of breath.  You have bloody  sputum.   You develop dehydration.  You faint or repeatedly feel like you are going to pass out.  You develop repeated vomiting.  You develop a severe headache. MAKE SURE YOU:   Understand these instructions.  Will watch your condition.  Will get help right away if you are not doing well or get worse.   This information is not intended to replace advice given to you by your health care provider. Make sure you discuss any questions you have with your health care provider.   Document Released: 11/30/2004 Document Revised: 11/13/2014 Document Reviewed: 04/15/2013 Elsevier Interactive Patient Education [employer].

## 2016-01-28 NOTE — Progress Notes (Signed)
Subjective:    Patient ID: Sharon [name], female    DOB: January 08, 1966, 50 y.o.   MRN: [id number] By signing my name below, I, Zola Button, attest that this documentation has been prepared under the direction and in the presence of Delman Cheadle, MD.  Electronically Signed: Zola Button, Medical Scribe. 01/28/2016. 7:45 PM.  Chief Complaint  Patient presents with  . Follow-up    still having cough, chest tightness    HPI HPI Comments: Sharon [name] is a 50 y.o. female who presents to the [facility] intermittent chest tightness that started about 3 weeks ago, but worsened today. Patient was seen by Ivar Drape, PA-C on 3/5 and was diagnosed with influenza A at that time. Her symptoms have not resolved as she is still having chest tightness and cough. She also reports having mild sore throat. Patient did take her prescription medications and has also tried Robitussin and other otc cough meds. She denies smoking and history of asthma. Has never used an inhaler prior but feels like she would like to try one for the chest tightness.  Past Medical History  Diagnosis Date  . Hypertension   . Seasonal allergies   . Allergy    Current Outpatient Prescriptions on File Prior to Visit  Medication Sig Dispense Refill  . amLODipine (NORVASC) 2.5 MG tablet Take 2.5 mg by mouth.    Marland Kitchen lisinopril-hydrochlorothiazide (PRINZIDE,ZESTORETIC) 20-25 MG per tablet Take 1 tablet by mouth daily. NO MORE REFILLS WITHOUT OFFICE VISIT - 2ND NOTICE 15 tablet 0   No current facility-administered medications on file prior to visit.   Allergies  Allergen Reactions  . Codeine Nausea And Vomiting and Other (See Comments)    Light headedness    Review of Systems  Constitutional: Positive for fever, chills, diaphoresis, activity change, appetite change and fatigue.  HENT: Positive for congestion, postnasal drip and sore throat. Negative for rhinorrhea and sinus pressure.   Respiratory: Positive  for cough, chest tightness and wheezing. Negative for shortness of breath.   Hematological: Positive for adenopathy.  Psychiatric/Behavioral: Positive for sleep disturbance.       Objective:  BP 120/80 mmHg  Pulse 62  Temp(Src) 98.6 F (37 C) (Oral)  Resp 18  Ht 5\' 5"  (1.651 m)  Wt 232 lb 3.2 oz (105.325 kg)  BMI 38.64 kg/m2  SpO2 97%  Physical Exam  Constitutional: She is oriented to person, place, and time. She appears well-developed and well-nourished. No distress.  HENT:  Head: Normocephalic and atraumatic.  Right Ear: Tympanic membrane is injected.  Left Ear: Tympanic membrane is injected.  Mouth/Throat: Oropharynx is clear and moist. No oropharyngeal exudate, posterior oropharyngeal edema or posterior oropharyngeal erythema.  Slight rhinitis. Oropharynx normal.  Eyes: Pupils are equal, round, and reactive to light.  Neck: Neck supple.  Cardiovascular: Normal rate.   Pulmonary/Chest: Effort normal and breath sounds normal. No respiratory distress. She has no wheezes. She has no rales.  Clear to auscultation bilaterally.   Musculoskeletal: She exhibits no edema.  Lymphadenopathy:    She has no cervical adenopathy.  Neurological: She is alert and oriented to person, place, and time. No cranial nerve deficit.  Skin: Skin is warm and dry. No rash noted.  Psychiatric: She has a normal mood and affect. Her behavior is normal.  Nursing note and vitals reviewed.   Results for orders placed or performed in visit on 01/28/16  POCT CBC  Result Value Ref Range   WBC 6.3 4.6 -  10.2 K/uL   Lymph, poc 2.5 0.6 - 3.4   POC LYMPH PERCENT 40.1 10 - 50 %L   MID (cbc) 0.3 0 - 0.9   POC MID % 4.9 0 - 12 %M   POC Granulocyte 3.5 2 - 6.9   Granulocyte percent 55.0 37 - 80 %G   RBC 4.63 4.04 - 5.48 M/uL   Hemoglobin 13.0 12.2 - 16.2 g/dL   HCT, POC 37.7 37.7 - 47.9 %   MCV 81.5 80 - 97 fL   MCH, POC 28.0 27 - 31.2 pg   MCHC 34.4 31.8 - 35.4 g/dL   RDW, POC 15.6 %   Platelet Count,  POC 340 142 - 424 K/uL   MPV 7.0 0 - 99.8 fL       Assessment & Plan:   1. Acute bronchitis, unspecified organism   promethazine dm on backorder so try tessalon or delsym.  Reviewed meds risk/benefits. rtc if worsening - warned of poss of immunosuppression with steroid.  Orders Placed This Encounter  Procedures  . POCT CBC    Meds ordered this encounter  Medications  . methylPREDNISolone acetate (DEPO-MEDROL) injection 80 mg    Sig:   . benzonatate (TESSALON) 200 MG capsule    Sig: Take 1 capsule (200 mg total) by mouth 3 (three) times daily as needed for cough.    Dispense:  30 capsule    Refill:  0  . Albuterol Sulfate (PROAIR RESPICLICK) 123XX123 (90 Base) MCG/ACT AEPB    Sig: Inhale 2 puffs into the lungs every 4 (four) hours as needed.    Dispense:  1 each    Refill:  0  . promethazine-dextromethorphan (PROMETHAZINE-DM) 6.25-15 MG/5ML syrup    Sig: Take 5-10 mLs by mouth 4 (four) times daily as needed for cough.    Dispense:  180 mL    Refill:  0    I personally performed the services described in this documentation, which was scribed in my presence. The recorded information has been reviewed and considered, and addended by me as needed.  Delman Cheadle, MD MPH

## 2016-02-04 NOTE — Progress Notes (Signed)
[facility] [address] [place] [id number] [phone]  Date:  01/09/2016   Name:  Sharon [name]   DOB:  1966-06-23   MRN:  [id number]  PCP:  Berkley Harvey, NP   Chief Complaint  Patient presents with  . Cough    sore throat, sinus congestion x 1 day, sneezing and headache     History of Present Illness:  Sharon [name] is a 50 y.o. female patient who presents to [facility] for cc of sore throat, sinus congestion and headache. Patient states that she has had a sore throat, sinus congestion for one day.  She then started to develop sneezing and a pretty prominent headache.  She has fever, chills, and some bodyaches.   There is not really a cough that is present.  She denies abdominal pain.  No sob or dyspnea.     Patient Active Problem List   Diagnosis Date Noted  . Diverticulosis 06/21/2014  . Fibroid, uterine 06/21/2014  . GERD 08/06/2009  . CHEST PAIN UNSPECIFIED 08/06/2009  . MORBID OBESITY 05/26/2008  . HYPERTENSION 02/19/2007    Past Medical History  Diagnosis Date  . Hypertension   . Seasonal allergies   . Allergy     Past Surgical History  Procedure Laterality Date  . Breast surgery      Social History  Substance Use Topics  . Smoking status: Never Smoker   . Smokeless tobacco: None  . Alcohol Use: No    Family History  Problem Relation Age of Onset  . Diabetes Mother     Allergies  Allergen Reactions  . Codeine Nausea And Vomiting and Other (See Comments)    Light headedness    Medication list has been reviewed and updated.  Current Outpatient Prescriptions on File Prior to Visit  Medication Sig Dispense Refill  . lisinopril-hydrochlorothiazide (PRINZIDE,ZESTORETIC) 20-25 MG per tablet Take 1 tablet by mouth daily. NO MORE REFILLS WITHOUT OFFICE VISIT - 2ND NOTICE 15 tablet 0   No current facility-administered medications on file prior to visit.    ROS   Physical Examination: BP 152/68 mmHg  Pulse 87  Temp(Src)  100.4 F (38 C) (Oral)  Resp 16  Ht 5\' 5"  (1.651 m)  Wt 227 lb (102.967 kg)  BMI 37.77 kg/m2  SpO2 98% Ideal Body Weight: Weight in (lb) to have BMI = 25: 149.9  Physical Exam  Constitutional: She is oriented to person, place, and time. She appears well-developed and well-nourished. No distress.  HENT:  Head: Normocephalic and atraumatic.  Right Ear: Tympanic membrane, external ear and ear canal normal.  Left Ear: Tympanic membrane, external ear and ear canal normal.  Nose: Mucosal edema and rhinorrhea present. Right sinus exhibits no maxillary sinus tenderness and no frontal sinus tenderness. Left sinus exhibits no maxillary sinus tenderness and no frontal sinus tenderness.  Mouth/Throat: No uvula swelling. No oropharyngeal exudate, posterior oropharyngeal edema or posterior oropharyngeal erythema.  Eyes: Conjunctivae and EOM are normal. Pupils are equal, round, and reactive to light.  Cardiovascular: Normal rate and regular rhythm.  Exam reveals no gallop, no distant heart sounds and no friction rub.   No murmur heard. Pulmonary/Chest: Effort normal. No respiratory distress. She has no decreased breath sounds. She has no wheezes. She has no rhonchi.  Lymphadenopathy:       Head (right side): No submandibular, no tonsillar, no preauricular and no posterior auricular adenopathy present.       Head (left side): No  submandibular, no tonsillar, no preauricular and no posterior auricular adenopathy present.  Neurological: She is alert and oriented to person, place, and time.  Skin: She is not diaphoretic.  Psychiatric: She has a normal mood and affect. Her behavior is normal.    Results for orders placed or performed in visit on 01/09/16  POCT Influenza A/B  Result Value Ref Range   Influenza A, POC Positive (A) Negative   Influenza B, POC Negative Negative    Assessment and Plan: Sharon [name] is a 50 y.o. female who is here today for sore throat, sinus congestion, sneezing and  headache for 1 day. This was found to be tamiflu.  She opts for treatment.  Declines cough medicine at this time.  She will treat supportively as well with mucinex.  Advised anti-pyretic use, heavy hydration and rest.   Influenza A - Plan: DISCONTINUED: oseltamivir (TAMIFLU) 75 MG capsule, DISCONTINUED: Guaifenesin (MUCINEX MAXIMUM STRENGTH) 1200 MG TB12  Other specified fever - Plan: ibuprofen (ADVIL,MOTRIN) tablet 400 mg, POCT Influenza A/B, DISCONTINUED: oseltamivir (TAMIFLU) 75 MG capsule  Flu-like symptoms - Plan: ibuprofen (ADVIL,MOTRIN) tablet 400 mg, POCT Influenza A/B, DISCONTINUED: oseltamivir (TAMIFLU) 75 MG capsule  Ivar Drape, PA-C [facility] 3/31/20177:06 AM    Ivar Drape, PA-C [facility] 02/04/2016 7:05 AM

## 2016-04-24 DIAGNOSIS — K080 Exfoliation of teeth due to systemic causes: Secondary | ICD-10-CM | POA: Diagnosis not present

## 2016-04-25 DIAGNOSIS — J3089 Other allergic rhinitis: Secondary | ICD-10-CM | POA: Diagnosis not present

## 2016-04-25 DIAGNOSIS — L738 Other specified follicular disorders: Secondary | ICD-10-CM | POA: Diagnosis not present

## 2016-04-25 DIAGNOSIS — H1089 Other conjunctivitis: Secondary | ICD-10-CM | POA: Diagnosis not present

## 2016-05-25 ENCOUNTER — Other Ambulatory Visit: Payer: Self-pay | Admitting: Physician Assistant

## 2016-07-01 ENCOUNTER — Ambulatory Visit (INDEPENDENT_AMBULATORY_CARE_PROVIDER_SITE_OTHER): Payer: Federal, State, Local not specified - PPO | Admitting: Physician Assistant

## 2016-07-01 VITALS — BP 136/92 | HR 56 | Temp 98.2°F | Resp 16 | Ht 65.0 in | Wt 233.4 lb

## 2016-07-01 DIAGNOSIS — J302 Other seasonal allergic rhinitis: Secondary | ICD-10-CM

## 2016-07-01 DIAGNOSIS — R42 Dizziness and giddiness: Secondary | ICD-10-CM | POA: Diagnosis not present

## 2016-07-01 MED ORDER — FLUTICASONE PROPIONATE 50 MCG/ACT NA SUSP: 2.0000 | Freq: Every day | NASAL | 12 refills | Status: DC

## 2016-07-01 MED ORDER — MONTELUKAST SODIUM 10 MG PO TABS: 10.0000 mg | ORAL_TABLET | Freq: Every day | ORAL | 3 refills | Status: DC

## 2016-07-01 MED ORDER — ACYCLOVIR 400 MG PO TABS: 400.0000 mg | ORAL_TABLET | Freq: Two times a day (BID) | ORAL | 0 refills | Status: DC

## 2016-07-01 NOTE — Patient Instructions (Signed)
     IF you received an x-ray today, you will receive an invoice from [facility]. Please contact [facility] at [phone] with questions or concerns regarding your invoice.   IF you received labwork today, you will receive an invoice from [facility]. Please contact [facility] at [phone] with questions or concerns regarding your invoice.   Our billing staff will not be able to assist you with questions regarding bills from these companies.  You will be contacted with the lab results as soon as they are available. The fastest way to get your results is to activate your My Chart account. Instructions are located on the last page of this paperwork. If you have not heard from us regarding the results in 2 weeks, please contact this office.      

## 2016-07-01 NOTE — Progress Notes (Signed)
   Sharon [name]  MRN: [id number] DOB: 12/28/1965  Subjective:  Pt presents to clinic with sinus problems that are intermittent.  Seems better when she is using her allergy medications and when she stops them her symptoms get worse.  She has had some lightheadedness that she wants to make sure that she does not have a sinus infection - she is having no headaches or sinus pressure.    She has been using Flonase and Claritin - when she uses the medications she starts to feel better.  Review of Systems  Constitutional: Negative for chills and fever.  HENT: Positive for congestion, postnasal drip, rhinorrhea (clear) and sore throat (end of yesterday).   Respiratory: Positive for cough (from the throat).        No h/o asthma, nonsmoker  Gastrointestinal: Positive for diarrhea.  Allergic/Immunologic: Positive for environmental allergies.  Neurological: Positive for light-headedness (associated with congestion). Negative for dizziness.    Patient Active Problem List   Diagnosis Date Noted  . Diverticulosis 06/21/2014  . Fibroid, uterine 06/21/2014  . GERD 08/06/2009  . CHEST PAIN UNSPECIFIED 08/06/2009  . MORBID OBESITY 05/26/2008  . HYPERTENSION 02/19/2007    Current Outpatient Prescriptions on File Prior to Visit  Medication Sig Dispense Refill  . amLODipine (NORVASC) 2.5 MG tablet Take 2.5 mg by mouth.    Marland Kitchen lisinopril-hydrochlorothiazide (PRINZIDE,ZESTORETIC) 20-25 MG per tablet Take 1 tablet by mouth daily. NO MORE REFILLS WITHOUT OFFICE VISIT - 2ND NOTICE 15 tablet 0   No current facility-administered medications on file prior to visit.     Allergies  Allergen Reactions  . Codeine Nausea And Vomiting and Other (See Comments)    Light headedness    Pt patients past, family and social history were reviewed and updated.  Objective:  BP (!) 136/92   Pulse (!) 56   Temp 98.2 F (36.8 C) (Oral)   Resp 16   Ht 5\' 5"  (1.651 m)   Wt 233 lb 6 oz (105.9 kg)   SpO2 98%   BMI  38.84 kg/m   Physical Exam  Constitutional: She is oriented to person, place, and time and well-developed, well-nourished, and in no distress.  HENT:  Head: Normocephalic and atraumatic.  Right Ear: Hearing, tympanic membrane, external ear and ear canal normal.  Left Ear: Hearing, tympanic membrane, external ear and ear canal normal.  Nose: Mucosal edema (swollen) present.  Mouth/Throat: Uvula is midline, oropharynx is clear and moist and mucous membranes are normal.  Eyes: Conjunctivae are normal.  Neck: Normal range of motion.  Cardiovascular: Normal rate, regular rhythm and normal heart sounds.   No murmur heard. Pulmonary/Chest: Effort normal and breath sounds normal.  Neurological: She is alert and oriented to person, place, and time. Gait normal.  Skin: Skin is warm and dry.  Psychiatric: Mood, memory, affect and judgment normal.  Vitals reviewed.   Assessment and Plan :  Lightheadedness  Other seasonal allergic rhinitis - Plan: fluticasone (FLONASE) 50 MCG/ACT nasal spray, montelukast (SINGULAIR) 10 MG tablet   Pt will work on hydration.  I do not suspect a bacterial sinus infection at this point - she will continue her Flonase and Claritin and she will start singulair.  She will continue cool compresses on her eyes to help with her eyelid swelling.  She will contact me in a week if things are not improving.  Windell Hummingbird PA-C  [facility] 07/01/2016 10:58 AM

## 2016-10-27 ENCOUNTER — Ambulatory Visit (INDEPENDENT_AMBULATORY_CARE_PROVIDER_SITE_OTHER): Payer: Federal, State, Local not specified - PPO | Admitting: Urgent Care

## 2016-10-27 VITALS — BP 134/82 | HR 69 | Temp 99.3°F | Resp 18 | Ht 65.0 in | Wt 227.0 lb

## 2016-10-27 DIAGNOSIS — J302 Other seasonal allergic rhinitis: Secondary | ICD-10-CM | POA: Diagnosis not present

## 2016-10-27 DIAGNOSIS — J321 Chronic frontal sinusitis: Secondary | ICD-10-CM

## 2016-10-27 MED ORDER — AMOXICILLIN 500 MG PO CAPS: 500.0000 mg | ORAL_CAPSULE | Freq: Two times a day (BID) | ORAL | 0 refills | Status: DC

## 2016-10-27 MED ORDER — MONTELUKAST SODIUM 10 MG PO TABS: 10.0000 mg | ORAL_TABLET | Freq: Every day | ORAL | 3 refills | Status: DC

## 2016-10-27 MED ORDER — LORATADINE 10 MG PO TABS: 10.0000 mg | ORAL_TABLET | Freq: Every day | ORAL | 3 refills | Status: DC

## 2016-10-27 NOTE — Patient Instructions (Addendum)
Sinusitis, Adult Sinusitis is soreness and inflammation of your sinuses. Sinuses are hollow spaces in the bones around your face. Your sinuses are located:  Around your eyes.  In the middle of your forehead.  Behind your nose.  In your cheekbones. Your sinuses and nasal passages are lined with a stringy fluid (mucus). Mucus normally drains out of your sinuses. When your nasal tissues become inflamed or swollen, the mucus can become trapped or blocked so air cannot flow through your sinuses. This allows bacteria, viruses, and funguses to grow, which leads to infection. Sinusitis can develop quickly and last for 7?10 days (acute) or for more than 12 weeks (chronic). Sinusitis often develops after a cold. What are the causes? This condition is caused by anything that creates swelling in the sinuses or stops mucus from draining, including:  Allergies.  Asthma.  Bacterial or viral infection.  Abnormally shaped bones between the nasal passages.  Nasal growths that contain mucus (nasal polyps).  Narrow sinus openings.  Pollutants, such as chemicals or irritants in the air.  A foreign object stuck in the nose.  A fungal infection. This is rare. What increases the risk? The following factors may make you more likely to develop this condition:  Having allergies or asthma.  Having had a recent cold or respiratory tract infection.  Having structural deformities or blockages in your nose or sinuses.  Having a weak immune system.  Doing a lot of swimming or diving.  Overusing nasal sprays.  Smoking. What are the signs or symptoms? The main symptoms of this condition are pain and a feeling of pressure around the affected sinuses. Other symptoms include:  Upper toothache.  Earache.  Headache.  Bad breath.  Decreased sense of smell and taste.  A cough that may get worse at night.  Fatigue.  Fever.  Thick drainage from your nose. The drainage is often green and it may  contain pus (purulent).  Stuffy nose or congestion.  Postnasal drip. This is when extra mucus collects in the throat or back of the nose.  Swelling and warmth over the affected sinuses.  Sore throat.  Sensitivity to light. How is this diagnosed? This condition is diagnosed based on symptoms, a medical history, and a physical exam. To find out if your condition is acute or chronic, your health care provider may:  Look in your nose for signs of nasal polyps.  Tap over the affected sinus to check for signs of infection.  View the inside of your sinuses using an imaging device that has a light attached (endoscope). If your health care provider suspects that you have chronic sinusitis, you may also:  Be tested for allergies.  Have a sample of mucus taken from your nose (nasal culture) and checked for bacteria.  Have a mucus sample examined to see if your sinusitis is related to an allergy. If your sinusitis does not respond to treatment and it lasts longer than 8 weeks, you may have an MRI or CT scan to check your sinuses. These scans also help to determine how severe your infection is. In rare cases, a bone biopsy may be done to rule out more serious types of fungal sinus disease. How is this treated? Treatment for sinusitis depends on the cause and whether your condition is chronic or acute. If a virus is causing your sinusitis, your symptoms will go away on their own within 10 days. You may be given medicines to relieve your symptoms, including:  Topical nasal decongestants. They   shrink swollen nasal passages and let mucus drain from your sinuses.  Antihistamines. These drugs block inflammation that is triggered by allergies. This can help to ease swelling in your nose and sinuses.  Topical nasal corticosteroids. These are nasal sprays that ease inflammation and swelling in your nose and sinuses.  Nasal saline washes. These rinses can help to get rid of thick mucus in your  nose. If your condition is caused by bacteria, you will be given an antibiotic medicine. If your condition is caused by a fungus, you will be given an antifungal medicine. Surgery may be needed to correct underlying conditions, such as narrow nasal passages. Surgery may also be needed to remove polyps. Follow these instructions at home: Medicines  Take, use, or apply over-the-counter and prescription medicines only as told by your health care provider. These may include nasal sprays.  If you were prescribed an antibiotic medicine, take it as told by your health care provider. Do not stop taking the antibiotic even if you start to feel better. Hydrate and Humidify  Drink enough water to keep your urine clear or pale yellow. Staying hydrated will help to thin your mucus.  Use a cool mist humidifier to keep the humidity level in your home above 50%.  Inhale steam for 10-15 minutes, 3-4 times a day or as told by your health care provider. You can do this in the bathroom while a hot shower is running.  Limit your exposure to cool or dry air. Rest  Rest as much as possible.  Sleep with your head raised (elevated).  Make sure to get enough sleep each night. General instructions  Apply a warm, moist washcloth to your face 3-4 times a day or as told by your health care provider. This will help with discomfort.  Wash your hands often with soap and water to reduce your exposure to viruses and other germs. If soap and water are not available, use hand sanitizer.  Do not smoke. Avoid being around people who are smoking (secondhand smoke).  Keep all follow-up visits as told by your health care provider. This is important. Contact a health care provider if:  You have a fever.  Your symptoms get worse.  Your symptoms do not improve within 10 days. Get help right away if:  You have a severe headache.  You have persistent vomiting.  You have pain or swelling around your face or  eyes.  You have vision problems.  You develop confusion.  Your neck is stiff.  You have trouble breathing. This information is not intended to replace advice given to you by your health care provider. Make sure you discuss any questions you have with your health care provider. Document Released: 10/23/2005 Document Revised: 06/18/2016 Document Reviewed: 08/18/2015 Elsevier Interactive Patient Education  2017 [place], Adult An allergy is when your body's defense system (immune system) overreacts to an otherwise harmless substance (allergen) that you breathe in or eat or something that touches your skin. When you come into contact with something that you are allergic to, your immune system produces certain proteins (antibodies). These proteins cause cells to release chemicals (histamines) that trigger the symptoms of an allergic reaction. Allergies often affect the nasal passages (allergic rhinitis), eyes (allergic conjunctivitis), skin (atopic dermatitis), and stomach. Allergies can be mild or severe. Allergies cannot spread from person to person (are not contagious). They can develop at any age and may be outgrown. What are the causes? Allergies can  be caused by any substance that your immune system mistakenly targets as harmful. These may include:  Outdoor allergens, such as pollen, grass, weeds, car exhaust, and mold spores.  Indoor allergens, such as dust, smoke, mold, and pet dander.  Foods, especially peanuts, milk, eggs, fish, shellfish, soy, nuts, and wheat.  Medicines, such as penicillin.  Skin irritants, such as detergents, chemicals, and latex.  Perfume.  Insect bites or stings. What increases the risk? You may be at greater risk of allergies if other people in your family have allergies. What are the signs or symptoms? Symptoms depend on what type of allergy you have. They may include:  Runny, stuffy nose.  Sneezing.  Itchy mouth, ears, or  throat.  Postnasal drip.  Sore throat.  Itchy, red, watery, or puffy eyes.  Skin rash or hives.  Stomach pain.  Vomiting.  Diarrhea.  Bloating.  Wheezing or coughing. People with a severe allergy to food, medicine, or an insect bite may have a life-threatening allergic reaction (anaphylaxis). Symptoms of anaphylaxis include:  Hives.  Itching.  Flushed face.  Swollen lips, tongue, or mouth.  Tight or swollen throat.  Chest pain or tightness in the chest.  Trouble breathing or shortness of breath.  Rapid heartbeat.  Dizziness or fainting.  Vomiting.  Diarrhea.  Pain in the abdomen. How is this diagnosed? This condition is diagnosed based on:  Your symptoms.  Your family and medical history.  A physical exam. You may need to see a health care provider who specializes in treating allergies (allergist). You may also have tests, including:  Skin tests to see which allergens are causing your symptoms, such as:  Skin prick test. In this test, your skin is pricked with a tiny needle and exposed to small amounts of possible allergens to see if your skin reacts.  Intradermal skin test. In this test, a small amount of allergen is injected under your skin to see if your skin reacts.  Patch test. In this test, a small amount of allergen is placed on your skin and then your skin is covered with a bandage. Your health care provider will check your skin after a couple of days to see if a rash has developed.  Blood tests.  Challenges tests. In this test, you inhale a small amount of allergen by mouth to see if you have an allergic reaction. You may also be asked to:  Keep a food diary. A food diary is a record of all the foods and drinks you have in a day and any symptoms you experience.  Practice an elimination diet. An elimination diet involves eliminating specific foods from your diet and then adding them back in one by one to find out if a certain food causes an  allergic reaction. How is this treated? Treatment for allergies depends on your symptoms. Treatment may include:  Cold compresses to soothe itching and swelling.  Eye drops.  Nasal sprays.  Using a saline spray or container (neti pot) to flush out the nose (nasal irrigation). These methods can help clear away mucus and keep the nasal passages moist.  Using a humidifier.  Oral antihistamines or other medicines to block allergic reaction and inflammation.  Skin creams to treat rashes or itching.  Diet changes to eliminate food allergy triggers.  Repeated exposure to tiny amounts of allergens to build up a tolerance and prevent future allergic reactions (immunotherapy). These include:  Allergy shots.  Oral treatment. This involves taking small doses of an allergen  under the tongue (sublingual immunotherapy).  Emergency epinephrine injection (auto-injector) in case of an allergic emergency. This is a self-injectable, pre-measured medicine that must be given within the first few minutes of a serious allergic reaction. Follow these instructions at home:  Avoid known allergens whenever possible.  If you suffer from airborne allergens, wash out your nose daily. You can do this with a saline spray or a neti pot to flush out your nose (nasal irrigation).  Take over-the-counter and prescription medicines only as told by your health care provider.  Keep all follow-up visits as told by your health care provider. This is important.  If you are at risk of a severe allergic reaction (anaphylaxis), keep your auto-injector with you at all times.  If you have ever had anaphylaxis, wear a medical alert bracelet or necklace that states you have a severe allergy. Contact a health care provider if:  Your symptoms do not improve with treatment. Get help right away if:  You have symptoms of anaphylaxis, such as:  Swollen mouth, tongue, or throat.  Pain or tightness in your chest.  Trouble  breathing or shortness of breath.  Dizziness or fainting.  Severe abdominal pain, vomiting, or diarrhea. This information is not intended to replace advice given to you by your health care provider. Make sure you discuss any questions you have with your health care provider. Document Released: 01/16/2003 Document Revised: 06/22/2016 Document Reviewed: 05/10/2016 Elsevier Interactive Patient Education  2017 [employer].      IF you received an x-ray today, you will receive an invoice from [facility]. Please contact [facility] at [phone] with questions or concerns regarding your invoice.   IF you received labwork today, you will receive an invoice from [facility]. Please contact [facility] at [phone] with questions or concerns regarding your invoice.   Our billing staff will not be able to assist you with questions regarding bills from these companies.  You will be contacted with the lab results as soon as they are available. The fastest way to get your results is to activate your My Chart account. Instructions are located on the last page of this paperwork. If you have not heard from [place] regarding the results in 2 weeks, please contact this office.

## 2016-10-27 NOTE — Progress Notes (Signed)
    MRN: [id number] DOB: Mar 25, 1966  Subjective:   Sharon [name] is a 50 y.o. female presenting for chief complaint of Sinusitis and Ear Fullness  Reports 2 week history of sinus pain, sinus headaches, sinus congestion, left ear fullness, intermittent bilateral pain, subjective fever, chills, puffy eyelids. She is developing a dry cough, sore throat now. Has tried Excedrin with minimal relief. Denies chest pain, shob, n/v, abdominal pain, rashes. Admits history of allergies, takes Claritin and Singulair for this, needs a refill, states that she is not always consistent with this medication. Denies history of asthma. Denies smoking cigarettes.  Sharon [name] has a current medication list which includes the following prescription(s): acyclovir, amlodipine, fluticasone, lisinopril-hydrochlorothiazide, and montelukast. Also is allergic to codeine.  Sharon [name]  has a past medical history of Allergy; Hypertension; and Seasonal allergies. Also  has a past surgical history that includes Breast surgery.  Objective:   Vitals: BP 134/82 (BP Location: Right Arm, Patient Position: Sitting, Cuff Size: Large)   Pulse 69   Temp 99.3 F (37.4 C) (Oral)   Resp 18   Ht 5\' 5"  (1.651 m)   Wt 227 lb (103 kg)   SpO2 98%   BMI 37.77 kg/m   Physical Exam  Constitutional: She is oriented to person, place, and time. She appears well-developed and well-nourished.  HENT:  TM's flat bilaterally but no effusions or erythema. Nasal turbinates boggy and edematous with frontal sinus tenderness. Mild postnasal drip present but without oropharyngeal exudates, erythema or abscesses.  Eyes: Right eye exhibits no discharge. Left eye exhibits no discharge.  Neck: Normal range of motion. Neck supple.  Cardiovascular: Normal rate, regular rhythm and intact distal pulses.  Exam reveals no gallop and no friction rub.   No murmur heard. Pulmonary/Chest: No respiratory distress. She has no wheezes. She has no rales.  Lymphadenopathy:   She has cervical adenopathy (mild, bilateral, anterior).  Neurological: She is alert and oriented to person, place, and time.  Skin: Skin is warm and dry.   Assessment and Plan :   1. Frontal sinusitis, unspecified chronicity - Start amoxicillin to address bacterial sinusitis. Advised supportive care otherwise. RTC in 1 week for recheck if symptoms persist.  2. Other seasonal allergic rhinitis - Restart Claritin with Singulair, continue Flonase for management of allergic rhinitis.  Jaynee Eagles, PA-C [facility] [phone] 10/27/2016 11:55 AM

## 2016-11-09 ENCOUNTER — Ambulatory Visit (INDEPENDENT_AMBULATORY_CARE_PROVIDER_SITE_OTHER): Payer: Federal, State, Local not specified - PPO | Admitting: Urgent Care

## 2016-11-09 ENCOUNTER — Encounter: Payer: Self-pay | Admitting: Urgent Care

## 2016-11-09 VITALS — BP 145/82 | HR 71 | Temp 98.1°F | Ht 65.0 in | Wt 231.4 lb

## 2016-11-09 DIAGNOSIS — I10 Essential (primary) hypertension: Secondary | ICD-10-CM

## 2016-11-09 DIAGNOSIS — J302 Other seasonal allergic rhinitis: Secondary | ICD-10-CM | POA: Diagnosis not present

## 2016-11-09 DIAGNOSIS — B9789 Other viral agents as the cause of diseases classified elsewhere: Secondary | ICD-10-CM

## 2016-11-09 DIAGNOSIS — J069 Acute upper respiratory infection, unspecified: Secondary | ICD-10-CM

## 2016-11-09 MED ORDER — BENZONATATE 100 MG PO CAPS: 100.0000 mg | ORAL_CAPSULE | Freq: Three times a day (TID) | ORAL | 0 refills | Status: DC | PRN

## 2016-11-09 MED ORDER — FLUTICASONE PROPIONATE 50 MCG/ACT NA SUSP: 2.0000 | Freq: Every day | NASAL | 12 refills | Status: DC

## 2016-11-09 NOTE — Patient Instructions (Signed)
Cough, Adult Coughing is a reflex that clears your throat and your airways. Coughing helps to heal and protect your lungs. It is normal to cough occasionally, but a cough that happens with other symptoms or lasts a long time may be a sign of a condition that needs treatment. A cough may last only 2-3 weeks (acute), or it may last longer than 8 weeks (chronic). What are the causes? Coughing is commonly caused by:  Breathing in substances that irritate your lungs.  A viral or bacterial respiratory infection.  Allergies.  Asthma.  Postnasal drip.  Smoking.  Acid backing up from the stomach into the esophagus (gastroesophageal reflux).  Certain medicines.  Chronic lung problems, including COPD (or rarely, lung cancer).  Other medical conditions such as heart failure.  Follow these instructions at home: Pay attention to any changes in your symptoms. Take these actions to help with your discomfort:  Take medicines only as told by your health care provider. ? If you were prescribed an antibiotic medicine, take it as told by your health care provider. Do not stop taking the antibiotic even if you start to feel better. ? Talk with your health care provider before you take a cough suppressant medicine.  Drink enough fluid to keep your urine clear or pale yellow.  If the air is dry, use a cold steam vaporizer or humidifier in your bedroom or your home to help loosen secretions.  Avoid anything that causes you to cough at work or at home.  If your cough is worse at night, try sleeping in a semi-upright position.  Avoid cigarette smoke. If you smoke, quit smoking. If you need help quitting, ask your health care provider.  Avoid caffeine.  Avoid alcohol.  Rest as needed.  Contact a health care provider if:  You have new symptoms.  You cough up pus.  Your cough does not get better after 2-3 weeks, or your cough gets worse.  You cannot control your cough with suppressant  medicines and you are losing sleep.  You develop pain that is getting worse or pain that is not controlled with pain medicines.  You have a fever.  You have unexplained weight loss.  You have night sweats. Get help right away if:  You cough up blood.  You have difficulty breathing.  Your heartbeat is very fast. This information is not intended to replace advice given to you by your health care provider. Make sure you discuss any questions you have with your health care provider. Document Released: 04/21/2011 Document Revised: 03/30/2016 Document Reviewed: 12/30/2014 Elsevier Interactive Patient Education  2017 Elsevier Inc.  

## 2016-11-09 NOTE — Progress Notes (Signed)
    MRN: [id number] DOB: 1966-01-14  Subjective:   Sharon [name] is a 51 y.o. female presenting for follow up on sinusitis. Patient was last seen 10/27/2016, started on amoxicillin to address bacterial sinusitis. She was also to restart her allergy medications including Claritin, Flonase and added Singulair. Today, patient reports some improvement in her sinus pain. Still has cough, now productive and elicits mild occasional chest pain and shob. Cough also interrupts her sleep. Has also had subjective fever. She has taken Claritin but did not start Flonase or Singulair. She did finish course of amoxicillin. Denies wheezing, hemoptysis. 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 Of note, her BP is managed by NP Jones. Denies heart racing, chronic headache, dizziness, n/v, abdominal pain, hematuria, lower leg swelling. Denies smoking cigarettes.  Sharon [name] has a current medication list which includes the following prescription(s): acyclovir, amlodipine, amoxicillin, fluticasone, lisinopril-hydrochlorothiazide, loratadine, and montelukast. Also is allergic to codeine.  Sharon [name]  has a past medical history of Allergy; Hypertension; and Seasonal allergies. Also  has a past surgical history that includes Breast surgery.  Objective:   Vitals: BP (!) 145/82 (BP Location: Right Arm, Patient Position: Sitting, Cuff Size: Large)   Pulse 71   Temp 98.1 F (36.7 C) (Oral)   Ht 5\' 5"  (1.651 m)   Wt 231 lb 6.4 oz (105 kg)   SpO2 98%   BMI 38.51 kg/m   BP Readings from Last 3 Encounters:  11/09/16 (!) 145/82  10/27/16 134/82  07/01/16 (!) 136/92    Physical Exam  Constitutional: She is oriented to person, place, and time. She appears well-developed and well-nourished.  HENT:  TM's intact bilaterally, no effusions or erythema. Nasal turbinates pink and moist, nasal passages patent. No sinus tenderness. Oropharynx clear,  mucous membranes moist, dentition in good repair.  Eyes: Right eye exhibits no discharge. Left eye exhibits no discharge.  Puffy eyelids bilaterally.  Neck: Normal range of motion. Neck supple.  Cardiovascular: Normal rate, regular rhythm and intact distal pulses.  Exam reveals no gallop and no friction rub.   No murmur heard. Pulmonary/Chest: No respiratory distress. She has no wheezes. She has no rales.  Lymphadenopathy:    She has no cervical adenopathy.  Neurological: She is alert and oriented to person, place, and time.  Skin: Skin is warm and dry.   Assessment and Plan :   1. Viral URI with cough - Patient's physical exam is reassuring. We do not have a function x-ray or radiology tech in our 104 building so I will hold off on x-ray. CBC is pending. Consider azithromycin if she has leukocytosis. Otherwise, Tessalon for cough suppression, rest, hydration and supportive care in general.  2. Other seasonal allergic rhinitis - Restart Flonase and Singulair.  3. Essential hypertension 4. Elevated blood pressure reading in office with diagnosis of hypertension - F/u with PCP for medication review or recheck with [place] in 4 weeks.  Jaynee Eagles, PA-C [facility] [phone] 11/09/2016 11:25 AM

## 2016-11-10 LAB — CBC
HEMATOCRIT: 40 % (ref 34.0–46.6)
Hemoglobin: 13 g/dL (ref 11.1–15.9)
MCH: 27 pg (ref 26.6–33.0)
MCHC: 32.5 g/dL (ref 31.5–35.7)
MCV: 83 fL (ref 79–97)
Platelets: 265 10*3/uL (ref 150–379)
RBC: 4.82 x10E6/uL (ref 3.77–5.28)
RDW: 15.2 % (ref 12.3–15.4)
WBC: 5 10*3/uL (ref 3.4–10.8)

## 2017-01-26 ENCOUNTER — Ambulatory Visit: Payer: Federal, State, Local not specified - PPO

## 2017-01-27 ENCOUNTER — Ambulatory Visit: Payer: Federal, State, Local not specified - PPO

## 2017-02-17 ENCOUNTER — Ambulatory Visit: Payer: Federal, State, Local not specified - PPO

## 2017-02-19 ENCOUNTER — Ambulatory Visit (INDEPENDENT_AMBULATORY_CARE_PROVIDER_SITE_OTHER): Payer: Federal, State, Local not specified - PPO | Admitting: Physician Assistant

## 2017-02-19 VITALS — BP 126/82 | HR 62 | Temp 98.3°F | Resp 17 | Ht 64.5 in | Wt 226.0 lb

## 2017-02-19 DIAGNOSIS — J302 Other seasonal allergic rhinitis: Secondary | ICD-10-CM | POA: Diagnosis not present

## 2017-02-19 DIAGNOSIS — R220 Localized swelling, mass and lump, head: Secondary | ICD-10-CM | POA: Diagnosis not present

## 2017-02-19 DIAGNOSIS — H04203 Unspecified epiphora, bilateral lacrimal glands: Secondary | ICD-10-CM

## 2017-02-19 DIAGNOSIS — J3489 Other specified disorders of nose and nasal sinuses: Secondary | ICD-10-CM

## 2017-02-19 MED ORDER — OLOPATADINE HCL 0.1 % OP SOLN: 1.0000 [drp] | Freq: Two times a day (BID) | OPHTHALMIC | 5 refills | Status: DC

## 2017-02-19 MED ORDER — FLUTICASONE PROPIONATE 50 MCG/ACT NA SUSP: 2.0000 | Freq: Every day | NASAL | 5 refills | Status: DC

## 2017-02-19 MED ORDER — AMOXICILLIN-POT CLAVULANATE 875-125 MG PO TABS: 1.0000 | ORAL_TABLET | Freq: Two times a day (BID) | ORAL | 0 refills | Status: DC

## 2017-02-19 MED ORDER — CETIRIZINE HCL 10 MG PO TABS: 10.0000 mg | ORAL_TABLET | Freq: Every day | ORAL | 11 refills | Status: DC

## 2017-02-19 NOTE — Patient Instructions (Addendum)
I am placing a CT to view this mass.  Please await contact for this imaging.   Please take antibiotic as prescribed.  Stop the claritin Start the zyrtec.     IF you received an x-ray today, you will receive an invoice from [facility]. Please contact [facility] at [phone] with questions or concerns regarding your invoice.   IF you received labwork today, you will receive an invoice from [facility]. Please contact [facility] at [phone] with questions or concerns regarding your invoice.   Our billing staff will not be able to assist you with questions regarding bills from these companies.  You will be contacted with the lab results as soon as they are available. The fastest way to get your results is to activate your My Chart account. Instructions are located on the last page of this paperwork. If you have not heard from [place] regarding the results in 2 weeks, please contact this office.

## 2017-02-19 NOTE — Progress Notes (Signed)
PRIMARY CARE AT [place], [place] [address] [phone]  Date:  02/19/2017   Name:  Sharon [name]   DOB:  [birth year]   MRN:  [id number]  PCP:  Berkley Harvey, NP    History of Present Illness:  Sharon [name] is a 51 y.o. female patient who presents to PCP with  Chief Complaint  Patient presents with  . Sinusitis   Eyes are swollen when she awakens.   Throat pain and coughing more at night.   Her left eye is watery all the time. She is contininuing the claritin at this time.   No fever.   Clear mucus from sinus.   Patient feels that there was a knot at the left side of her face.   There are no nose bleeds.   No noticed weight loss.   She is able to breathe through her nose. Wt Readings from Last 3 Encounters:  02/19/17 226 lb (102.5 kg)  11/09/16 231 lb 6.4 oz (105 kg)  10/27/16 227 lb (103 kg)        Patient Active Problem List   Diagnosis Date Noted  . Diverticulosis 06/21/2014  . Fibroid, uterine 06/21/2014  . GERD 08/06/2009  . CHEST PAIN UNSPECIFIED 08/06/2009  . MORBID OBESITY 05/26/2008  . HYPERTENSION 02/19/2007    Past Medical History:  Diagnosis Date  . Allergy   . Hypertension   . Seasonal allergies     Past Surgical History:  Procedure Laterality Date  . BREAST SURGERY      Social History  Substance Use Topics  . Smoking status: Never Smoker  . Smokeless tobacco: Never Used  . Alcohol use No    Family History  Problem Relation Age of Onset  . Diabetes Mother     Allergies  Allergen Reactions  . Codeine Nausea And Vomiting and Other (See Comments)    Light headedness    Medication list has been reviewed and updated.  Current Outpatient Prescriptions on File Prior to Visit  Medication Sig Dispense Refill  . amLODipine (NORVASC) 2.5 MG tablet Take 2.5 mg by mouth.    . benzonatate (TESSALON) 100 MG capsule Take 1-2 capsules (100-200 mg total) by mouth 3 (three) times daily as needed for cough. 60 capsule 0  .  fluticasone (FLONASE) 50 MCG/ACT nasal spray Place 2 sprays into both nostrils daily. 16 g 12  . lisinopril-hydrochlorothiazide (PRINZIDE,ZESTORETIC) 20-25 MG per tablet Take 1 tablet by mouth daily. NO MORE REFILLS WITHOUT OFFICE VISIT - 2ND NOTICE 15 tablet 0  . loratadine (CLARITIN) 10 MG tablet Take 1 tablet (10 mg total) by mouth daily. 90 tablet 3  . montelukast (SINGULAIR) 10 MG tablet Take 1 tablet (10 mg total) by mouth at bedtime. 30 tablet 3   No current facility-administered medications on file prior to visit.     ROS ROS otherwise unremarkable unless listed above.  Physical Examination: BP 126/82   Pulse 62   Temp 98.3 F (36.8 C) (Oral)   Resp 17   Ht 5' 4.5" (1.638 m)   Wt 226 lb (102.5 kg)   SpO2 97%   BMI 38.19 kg/m  Ideal Body Weight: Weight in (lb) to have BMI = 25: 147.6  Physical Exam  Constitutional: She is oriented to person, place, and time. She appears well-developed and well-nourished. No distress.  HENT:  Head: Normocephalic and atraumatic.  Right Ear: Tympanic membrane, external ear and ear canal normal.  Left Ear: Tympanic membrane, external ear  and ear canal normal.  Nose: Mucosal edema and rhinorrhea present. Right sinus exhibits no maxillary sinus tenderness and no frontal sinus tenderness. Left sinus exhibits no maxillary sinus tenderness and no frontal sinus tenderness.  Mouth/Throat: No uvula swelling. No oropharyngeal exudate, posterior oropharyngeal edema or posterior oropharyngeal erythema.  Immobile circumferential nodule that is tender at the maxillary just 2cm from the left alarm.  No erythema.   Left upper premolar with filling intact.  No erythema drainage, or swelling along the gumline.  No tenderness.   Eyes: Conjunctivae and EOM are normal. Pupils are equal, round, and reactive to light.  Cardiovascular: Normal rate and regular rhythm.  Exam reveals no gallop, no distant heart sounds and no friction rub.   No murmur  heard. Pulmonary/Chest: Effort normal. No respiratory distress. She has no decreased breath sounds. She has no wheezes. She has no rhonchi.  Lymphadenopathy:       Head (right side): No submandibular, no tonsillar, no preauricular and no posterior auricular adenopathy present.       Head (left side): No submandibular, no tonsillar, no preauricular and no posterior auricular adenopathy present.  Neurological: She is alert and oriented to person, place, and time.  Skin: She is not diaphoretic.  Psychiatric: She has a normal mood and affect. Her behavior is normal.     Assessment and Plan: Sharon [name] is a 51 y.o. female who is here today for facial pain. Will strart treatment with augmentin.  Advised switching to zyrtec from long time use of claritin.  flonase given. Polyp vs cyst vs malignancy--will obtain ct at this time. Facial mass - Plan: CT Maxillofacial W/Cm  Maxillary sinus mass - Plan: CT Maxillofacial W/Cm, cetirizine (ZYRTEC) 10 MG tablet, amoxicillin-clavulanate (AUGMENTIN) 875-125 MG tablet, fluticasone (FLONASE) 50 MCG/ACT nasal spray  Seasonal allergic rhinitis, unspecified trigger - Plan: CT Maxillofacial W/Cm, cetirizine (ZYRTEC) 10 MG tablet, fluticasone (FLONASE) 50 MCG/ACT nasal spray, olopatadine (PATANOL) 0.1 % ophthalmic solution  Watery eyes - Plan: olopatadine (PATANOL) 0.1 % ophthalmic solution  Ivar Drape, PA-C [facility] 4/17/20188:58 PM

## 2017-05-27 DIAGNOSIS — J029 Acute pharyngitis, unspecified: Secondary | ICD-10-CM | POA: Diagnosis not present

## 2017-05-27 DIAGNOSIS — J069 Acute upper respiratory infection, unspecified: Secondary | ICD-10-CM | POA: Diagnosis not present

## 2017-05-27 DIAGNOSIS — I10 Essential (primary) hypertension: Secondary | ICD-10-CM | POA: Diagnosis not present

## 2017-06-12 DIAGNOSIS — M25511 Pain in right shoulder: Secondary | ICD-10-CM | POA: Diagnosis not present

## 2017-07-28 ENCOUNTER — Ambulatory Visit: Payer: Federal, State, Local not specified - PPO | Admitting: Family Medicine

## 2017-07-28 NOTE — Progress Notes (Deleted)
  No chief complaint on file.   HPI  Past Medical History:  Diagnosis Date  . Allergy   . Hypertension   . Seasonal allergies     Current Outpatient Prescriptions  Medication Sig Dispense Refill  . amLODipine (NORVASC) 2.5 MG tablet Take 2.5 mg by mouth.    Marland Kitchen amoxicillin-clavulanate (AUGMENTIN) 875-125 MG tablet Take 1 tablet by mouth 2 (two) times daily. 20 tablet 0  . benzonatate (TESSALON) 100 MG capsule Take 1-2 capsules (100-200 mg total) by mouth 3 (three) times daily as needed for cough. 60 capsule 0  . cetirizine (ZYRTEC) 10 MG tablet Take 1 tablet (10 mg total) by mouth daily. 30 tablet 11  . fluticasone (FLONASE) 50 MCG/ACT nasal spray Place 2 sprays into both nostrils daily. 9.9 g 5  . lisinopril-hydrochlorothiazide (PRINZIDE,ZESTORETIC) 20-25 MG per tablet Take 1 tablet by mouth daily. NO MORE REFILLS WITHOUT OFFICE VISIT - 2ND NOTICE 15 tablet 0  . loratadine (CLARITIN) 10 MG tablet Take 1 tablet (10 mg total) by mouth daily. 90 tablet 3  . montelukast (SINGULAIR) 10 MG tablet Take 1 tablet (10 mg total) by mouth at bedtime. 30 tablet 3  . olopatadine (PATANOL) 0.1 % ophthalmic solution Place 1 drop into both eyes 2 (two) times daily. 5 mL 5   No current facility-administered medications for this visit.     Allergies:  Allergies  Allergen Reactions  . Codeine Nausea And Vomiting and Other (See Comments)    Light headedness    Past Surgical History:  Procedure Laterality Date  . BREAST SURGERY      Social History   Social History  . Marital status: Single    Spouse name: N/A  . Number of children: N/A  . Years of education: N/A   Occupational History  . mail carrier [employer]   Social History Main Topics  . Smoking status: Never Smoker  . Smokeless tobacco: Never Used  . Alcohol use No  . Drug use: No  . Sexual activity: Yes    Birth control/ protection: None   Other Topics Concern  . Not on file   Social History Narrative  . No narrative on file      ROS  Objective: There were no vitals filed for this visit.  Physical Exam  Assessment and Plan There are no diagnoses linked to this encounter.   Sharon [name] [employer]

## 2017-08-10 ENCOUNTER — Ambulatory Visit (INDEPENDENT_AMBULATORY_CARE_PROVIDER_SITE_OTHER): Payer: Federal, State, Local not specified - PPO | Admitting: Family Medicine

## 2017-08-10 ENCOUNTER — Encounter: Payer: Self-pay | Admitting: Family Medicine

## 2017-08-10 VITALS — BP 152/82 | HR 84 | Temp 98.0°F | Resp 16 | Ht 64.57 in | Wt 230.0 lb

## 2017-08-10 DIAGNOSIS — I10 Essential (primary) hypertension: Secondary | ICD-10-CM

## 2017-08-10 MED ORDER — LISINOPRIL-HYDROCHLOROTHIAZIDE 20-25 MG PO TABS: 1.0000 | ORAL_TABLET | Freq: Every day | ORAL | 2 refills | Status: DC

## 2017-08-10 MED ORDER — AMLODIPINE BESYLATE 2.5 MG PO TABS: 2.5000 mg | ORAL_TABLET | Freq: Every day | ORAL | 2 refills | Status: DC

## 2017-08-10 NOTE — Progress Notes (Signed)
10/5/20186:45 PM  Sharon [name] 08-21-1966, 51 y.o. female [id number]  Chief Complaint  Patient presents with  . Hypertension    follow-up   . Medication Refill    HPI:   Patient is a 51 y.o. female with past medical history significant for HTN who presents today requesting med refill. Last saw PCP over a year ago. Ran out of her BP meds 2 days ago. Does not exercise nor follow diet. 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   Depression screen [facility] 2/9 08/10/2017 02/19/2017 11/09/2016  Decreased Interest 0 0 0  Down, Depressed, Hopeless 0 0 0  PHQ - 2 Score 0 0 0    Allergies  Allergen Reactions  . Codeine Nausea And Vomiting and Other (See Comments)    Light headedness    Prior to Admission medications   Medication Sig Start Date End Date Taking? Authorizing Provider  cetirizine (ZYRTEC) 10 MG tablet Take 1 tablet (10 mg total) by mouth daily. 02/19/17  Yes English, Stephanie D, PA  fluticasone (FLONASE) 50 MCG/ACT nasal spray Place 2 sprays into both nostrils daily. 02/19/17  Yes English, Colletta Maryland D, PA  lisinopril-hydrochlorothiazide (PRINZIDE,ZESTORETIC) 20-25 MG tablet Take 1 tablet by mouth daily. 08/10/17  Yes Rutherford Guys, MD  loratadine (CLARITIN) 10 MG tablet Take 1 tablet (10 mg total) by mouth daily. 10/27/16 10/27/17 Yes Jaynee Eagles, PA-C  montelukast (SINGULAIR) 10 MG tablet Take 1 tablet (10 mg total) by mouth at bedtime. 10/27/16  Yes Jaynee Eagles, PA-C  olopatadine (PATANOL) 0.1 % ophthalmic solution Place 1 drop into both eyes 2 (two) times daily. 02/19/17  Yes English, Colletta Maryland D, PA  amLODipine (NORVASC) 2.5 MG tablet Take 1 tablet (2.5 mg total) by mouth daily. 08/10/17 10/22/18  Rutherford Guys, MD    Past Medical History:  Diagnosis Date  . Allergy   . Hypertension   . Seasonal allergies     Past Surgical History:  Procedure Laterality Date  . BREAST  SURGERY      Social History  Substance Use Topics  . Smoking status: Never Smoker  . Smokeless tobacco: Never Used  . Alcohol use No    Family History  Problem Relation Age of Onset  . Diabetes Mother     Review of Systems  Constitutional: Negative for chills and fever.  Respiratory: Negative for cough and shortness of breath.   Cardiovascular: Negative for chest pain, palpitations and leg swelling.  Gastrointestinal: Negative for abdominal pain, nausea and vomiting.     OBJECTIVE:  Blood pressure (!) 152/82, pulse 84, temperature 98 F (36.7 C), temperature source Oral, resp. rate 16, height 5' 4.57" (1.64 m), weight 230 lb (104.3 kg), SpO2 95 %.  Physical Exam  Constitutional: She is oriented to person, place, and time and well-developed, well-nourished, and in no distress.  HENT:  Head: Normocephalic and atraumatic.  Mouth/Throat: Oropharynx is clear and moist. No oropharyngeal exudate.  Eyes: Pupils are equal, round, and reactive to light. EOM are normal. No scleral icterus.  Neck: Neck supple.  Cardiovascular: Normal rate, regular rhythm and normal heart sounds.  Exam reveals no gallop and no friction rub.   No murmur heard. Pulmonary/Chest: Effort normal and breath sounds normal. She has no wheezes. She has no rales.  Musculoskeletal: She exhibits no edema.  Neurological: She is alert and oriented to person, place, and time. Gait normal.  Skin:  Skin is warm and dry.    ASSESSMENT and PLAN  1. Essential hypertension Above goal, off meds, Refilling medications. Discussed low salt diet, exercise. Referring to medical weight loss. Ordering HCM labs. Advised appt for CPE.   - CBC with Differential - Comprehensive metabolic panel - Lipid panel - TSH - Amb Ref to Medical Weight Management  2. MORBID OBESITY - Amb Ref to Medical Weight Management  Other orders - amLODipine (NORVASC) 2.5 MG tablet; Take 1 tablet (2.5 mg total) by mouth daily. -  lisinopril-hydrochlorothiazide (PRINZIDE,ZESTORETIC) 20-25 MG tablet; Take 1 tablet by mouth daily.  Return in about 2 months (around 10/10/2017).    Rutherford Guys, MD Primary Care at [place], [place] [address] Ph.  [phone] Fax [phone]

## 2017-08-10 NOTE — Patient Instructions (Signed)
     IF you received an x-ray today, you will receive an invoice from [facility]. Please contact [facility] at [phone] with questions or concerns regarding your invoice.   IF you received labwork today, you will receive an invoice from [facility]. Please contact [facility] at [phone] with questions or concerns regarding your invoice.   Our billing staff will not be able to assist you with questions regarding bills from these companies.  You will be contacted with the lab results as soon as they are available. The fastest way to get your results is to activate your My Chart account. Instructions are located on the last page of this paperwork. If you have not heard from us regarding the results in 2 weeks, please contact this office.     

## 2017-08-11 LAB — LIPID PANEL
Chol/HDL Ratio: 2.7 ratio (ref 0.0–4.4)
Cholesterol, Total: 187 mg/dL (ref 100–199)
HDL: 70 mg/dL (ref >39)
LDL Calculated: 102 mg/dL — ABNORMAL HIGH (ref 0–99)
Triglycerides: 73 mg/dL (ref 0–149)
VLDL Cholesterol Cal: 15 mg/dL (ref 5–40)

## 2017-08-11 LAB — CBC WITH DIFFERENTIAL/PLATELET
Basophils Absolute: 0 10*3/uL (ref 0.0–0.2)
Basos: 1 %
EOS (ABSOLUTE): 0.1 10*3/uL (ref 0.0–0.4)
Eos: 2 %
Hematocrit: 38.4 % (ref 34.0–46.6)
Hemoglobin: 12.7 g/dL (ref 11.1–15.9)
Immature Grans (Abs): 0 10*3/uL (ref 0.0–0.1)
Immature Granulocytes: 0 %
Lymphocytes Absolute: 2 10*3/uL (ref 0.7–3.1)
Lymphs: 32 %
MCH: 26.7 pg (ref 26.6–33.0)
MCHC: 33.1 g/dL (ref 31.5–35.7)
MCV: 81 fL (ref 79–97)
Monocytes Absolute: 0.4 10*3/uL (ref 0.1–0.9)
Monocytes: 6 %
Neutrophils Absolute: 3.7 10*3/uL (ref 1.4–7.0)
Neutrophils: 59 %
Platelets: 272 10*3/uL (ref 150–379)
RBC: 4.76 x10E6/uL (ref 3.77–5.28)
RDW: 15.5 % — ABNORMAL HIGH (ref 12.3–15.4)
WBC: 6.3 10*3/uL (ref 3.4–10.8)

## 2017-08-11 LAB — COMPREHENSIVE METABOLIC PANEL
ALT: 19 IU/L (ref 0–32)
AST: 24 IU/L (ref 0–40)
Albumin/Globulin Ratio: 2 (ref 1.2–2.2)
Albumin: 4.4 g/dL (ref 3.5–5.5)
Alkaline Phosphatase: 67 IU/L (ref 39–117)
BUN/Creatinine Ratio: 14 (ref 9–23)
BUN: 13 mg/dL (ref 6–24)
Bilirubin Total: <0.2 mg/dL (ref 0.0–1.2)
CO2: 20 mmol/L (ref 20–29)
Calcium: 9.5 mg/dL (ref 8.7–10.2)
Chloride: 107 mmol/L — ABNORMAL HIGH (ref 96–106)
Creatinine, Ser: 0.91 mg/dL (ref 0.57–1.00)
GFR calc Af Amer: 83 mL/min/{1.73_m2} (ref >59)
GFR calc non Af Amer: 72 mL/min/{1.73_m2} (ref >59)
Globulin, Total: 2.2 g/dL (ref 1.5–4.5)
Glucose: 91 mg/dL (ref 65–99)
Potassium: 4.2 mmol/L (ref 3.5–5.2)
Sodium: 144 mmol/L (ref 134–144)
Total Protein: 6.6 g/dL (ref 6.0–8.5)

## 2017-08-11 LAB — TSH: TSH: 1.76 u[IU]/mL (ref 0.450–4.500)

## 2017-08-13 ENCOUNTER — Encounter: Payer: Self-pay | Admitting: Family Medicine

## 2017-12-06 ENCOUNTER — Encounter (INDEPENDENT_AMBULATORY_CARE_PROVIDER_SITE_OTHER): Payer: Federal, State, Local not specified - PPO

## 2017-12-21 ENCOUNTER — Ambulatory Visit: Payer: Federal, State, Local not specified - PPO | Admitting: Physician Assistant

## 2017-12-22 ENCOUNTER — Encounter: Payer: Federal, State, Local not specified - PPO | Admitting: Physician Assistant

## 2018-01-26 ENCOUNTER — Ambulatory Visit (INDEPENDENT_AMBULATORY_CARE_PROVIDER_SITE_OTHER): Payer: Federal, State, Local not specified - PPO | Admitting: Physician Assistant

## 2018-01-26 ENCOUNTER — Encounter: Payer: Self-pay | Admitting: Physician Assistant

## 2018-01-26 ENCOUNTER — Other Ambulatory Visit: Payer: Self-pay

## 2018-01-26 VITALS — BP 160/90 | HR 64 | Temp 98.8°F | Resp 18 | Ht 64.17 in | Wt 226.8 lb

## 2018-01-26 DIAGNOSIS — Z23 Encounter for immunization: Secondary | ICD-10-CM | POA: Diagnosis not present

## 2018-01-26 DIAGNOSIS — N898 Other specified noninflammatory disorders of vagina: Secondary | ICD-10-CM

## 2018-01-26 DIAGNOSIS — G479 Sleep disorder, unspecified: Secondary | ICD-10-CM

## 2018-01-26 DIAGNOSIS — R5383 Other fatigue: Secondary | ICD-10-CM | POA: Diagnosis not present

## 2018-01-26 DIAGNOSIS — Z13220 Encounter for screening for lipoid disorders: Secondary | ICD-10-CM

## 2018-01-26 DIAGNOSIS — B009 Herpesviral infection, unspecified: Secondary | ICD-10-CM

## 2018-01-26 DIAGNOSIS — Z1159 Encounter for screening for other viral diseases: Secondary | ICD-10-CM

## 2018-01-26 DIAGNOSIS — Z1321 Encounter for screening for nutritional disorder: Secondary | ICD-10-CM | POA: Diagnosis not present

## 2018-01-26 DIAGNOSIS — N644 Mastodynia: Secondary | ICD-10-CM

## 2018-01-26 DIAGNOSIS — Z0000 Encounter for general adult medical examination without abnormal findings: Secondary | ICD-10-CM

## 2018-01-26 DIAGNOSIS — J302 Other seasonal allergic rhinitis: Secondary | ICD-10-CM | POA: Diagnosis not present

## 2018-01-26 DIAGNOSIS — Z01419 Encounter for gynecological examination (general) (routine) without abnormal findings: Secondary | ICD-10-CM

## 2018-01-26 DIAGNOSIS — I10 Essential (primary) hypertension: Secondary | ICD-10-CM | POA: Diagnosis not present

## 2018-01-26 DIAGNOSIS — Z1211 Encounter for screening for malignant neoplasm of colon: Secondary | ICD-10-CM | POA: Diagnosis not present

## 2018-01-26 DIAGNOSIS — Z113 Encounter for screening for infections with a predominantly sexual mode of transmission: Secondary | ICD-10-CM

## 2018-01-26 DIAGNOSIS — Z130 Encounter for screening for diseases of the blood and blood-forming organs and certain disorders involving the immune mechanism: Secondary | ICD-10-CM | POA: Diagnosis not present

## 2018-01-26 DIAGNOSIS — L819 Disorder of pigmentation, unspecified: Secondary | ICD-10-CM

## 2018-01-26 DIAGNOSIS — Z114 Encounter for screening for human immunodeficiency virus [HIV]: Secondary | ICD-10-CM

## 2018-01-26 LAB — POCT WET + KOH PREP
TRICH BY WET PREP: ABSENT
YEAST BY WET PREP: ABSENT
Yeast by KOH: ABSENT

## 2018-01-26 MED ORDER — VALACYCLOVIR HCL 500 MG PO TABS: ORAL_TABLET | ORAL | 0 refills | Status: DC

## 2018-01-26 MED ORDER — LISINOPRIL-HYDROCHLOROTHIAZIDE 20-25 MG PO TABS: 1.0000 | ORAL_TABLET | Freq: Every day | ORAL | 0 refills | Status: DC

## 2018-01-26 MED ORDER — AMLODIPINE BESYLATE 5 MG PO TABS: 5.0000 mg | ORAL_TABLET | Freq: Every day | ORAL | 0 refills | Status: DC

## 2018-01-26 MED ORDER — MONTELUKAST SODIUM 10 MG PO TABS: 10.0000 mg | ORAL_TABLET | Freq: Every day | ORAL | 3 refills | Status: AC

## 2018-01-26 NOTE — Patient Instructions (Addendum)
Try melatonin extended release - to help with sleep Magnesium - relaxing calm as the brand -   Please download the APP called mychart - then use the text to activate this APP - this will allow you to look at your labs and contact me as well as make appointments to see me in the future.  Vaginal dryness - use lubricant - astroglide is a thick one that is helpful   IF you received an x-ray today, you will receive an invoice from [facility]. Please contact [facility] at [phone] with questions or concerns regarding your invoice.   IF you received labwork today, you will receive an invoice from [facility]. Please contact [facility] at [phone] with questions or concerns regarding your invoice.   Our billing staff will not be able to assist you with questions regarding bills from these companies.  You will be contacted with the lab results as soon as they are available. The fastest way to get your results is to activate your My Chart account. Instructions are located on the last page of this paperwork. If you have not heard from [place] regarding the results in 2 weeks, please contact this office.    Health Maintenance, Female Adopting a healthy lifestyle and getting preventive care can go a long way to promote health and wellness. Talk with your health care provider about what schedule of regular examinations is right for you. This is a good chance for you to check in with your provider about disease prevention and staying healthy. In between checkups, there are plenty of things you can do on your own. Experts have done a lot of research about which lifestyle changes and preventive measures are most likely to keep you healthy. Ask your health care provider for more information. Weight and diet Eat a healthy diet  Be sure to include plenty of vegetables, fruits, low-fat dairy products, and lean protein.  Do not eat a lot of foods high in solid fats, added sugars, or  salt.  Get regular exercise. This is one of the most important things you can do for your health. ? Most adults should exercise for at least 150 minutes each week. The exercise should increase your heart rate and make you sweat (moderate-intensity exercise). ? Most adults should also do strengthening exercises at least twice a week. This is in addition to the moderate-intensity exercise.  Maintain a healthy weight  Body mass index (BMI) is a measurement that can be used to identify possible weight problems. It estimates body fat based on height and weight. Your health care provider can help determine your BMI and help you achieve or maintain a healthy weight.  For females 38 years of age and older: ? A BMI below 18.5 is considered underweight. ? A BMI of 18.5 to 24.9 is normal. ? A BMI of 25 to 29.9 is considered overweight. ? A BMI of 30 and above is considered obese.  Watch levels of cholesterol and blood lipids  You should start having your blood tested for lipids and cholesterol at 54 years of age, then have this test every 5 years.  You may need to have your cholesterol levels checked more often if: ? Your lipid or cholesterol levels are high. ? You are older than 54 years of age. ? You are at high risk for heart disease.  Cancer screening Lung Cancer  Lung cancer screening is recommended for adults 73-5 years old who are at high risk for lung cancer because of  a history of smoking.  A yearly low-dose CT scan of the lungs is recommended for people who: ? Currently smoke. ? Have quit within the past 15 years. ? Have at least a 30-pack-year history of smoking. A pack year is smoking an average of one pack of cigarettes a day for 1 year.  Yearly screening should continue until it has been 15 years since you quit.  Yearly screening should stop if you develop a health problem that would prevent you from having lung cancer treatment.  Breast Cancer  Practice breast  self-awareness. This means understanding how your breasts normally appear and feel.  It also means doing regular breast self-exams. Let your health care provider know about any changes, no matter how small.  If you are in your 20s or 30s, you should have a clinical breast exam (CBE) by a health care provider every 1-3 years as part of a regular health exam.  If you are 64 or older, have a CBE every year. Also consider having a breast X-ray (mammogram) every year.  If you have a family history of breast cancer, talk to your health care provider about genetic screening.  If you are at high risk for breast cancer, talk to your health care provider about having an MRI and a mammogram every year.  Breast cancer gene (BRCA) assessment is recommended for women who have family members with BRCA-related cancers. BRCA-related cancers include: ? Breast. ? Ovarian. ? Tubal. ? Peritoneal cancers.  Results of the assessment will determine the need for genetic counseling and BRCA1 and BRCA2 testing.  Cervical Cancer Your health care provider may recommend that you be screened regularly for cancer of the pelvic organs (ovaries, uterus, and vagina). This screening involves a pelvic examination, including checking for microscopic changes to the surface of your cervix (Pap test). You may be encouraged to have this screening done every 3 years, beginning at age 3.  For women ages 82-65, health care providers may recommend pelvic exams and Pap testing every 3 years, or they may recommend the Pap and pelvic exam, combined with testing for human papilloma virus (HPV), every 5 years. Some types of HPV increase your risk of cervical cancer. Testing for HPV may also be done on women of any age with unclear Pap test results.  Other health care providers may not recommend any screening for nonpregnant women who are considered low risk for pelvic cancer and who do not have symptoms. Ask your health care provider if a  screening pelvic exam is right for you.  If you have had past treatment for cervical cancer or a condition that could lead to cancer, you need Pap tests and screening for cancer for at least 20 years after your treatment. If Pap tests have been discontinued, your risk factors (such as having a new sexual partner) need to be reassessed to determine if screening should resume. Some women have medical problems that increase the chance of getting cervical cancer. In these cases, your health care provider may recommend more frequent screening and Pap tests.  Colorectal Cancer  This type of cancer can be detected and often prevented.  Routine colorectal cancer screening usually begins at 54 years of age and continues through 54 years of age.  Your health care provider may recommend screening at an earlier age if you have risk factors for colon cancer.  Your health care provider may also recommend using home test kits to check for hidden blood in the stool.  A  small camera at the end of a tube can be used to examine your colon directly (sigmoidoscopy or colonoscopy). This is done to check for the earliest forms of colorectal cancer.  Routine screening usually begins at age 79.  Direct examination of the colon should be repeated every 5-10 years through 54 years of age. However, you may need to be screened more often if early forms of precancerous polyps or small growths are found.  Skin Cancer  Check your skin from head to toe regularly.  Tell your health care provider about any new moles or changes in moles, especially if there is a change in a mole's shape or color.  Also tell your health care provider if you have a mole that is larger than the size of a pencil eraser.  Always use sunscreen. Apply sunscreen liberally and repeatedly throughout the day.  Protect yourself by wearing long sleeves, pants, a wide-brimmed hat, and sunglasses whenever you are outside.  Heart disease, diabetes, and  high blood pressure  High blood pressure causes heart disease and increases the risk of stroke. High blood pressure is more likely to develop in: ? People who have blood pressure in the high end of the normal range (130-139/85-89 mm Hg). ? People who are overweight or obese. ? People who are African American.  If you are 68-77 years of age, have your blood pressure checked every 3-5 years. If you are 35 years of age or older, have your blood pressure checked every year. You should have your blood pressure measured twice-once when you are at a hospital or clinic, and once when you are not at a hospital or clinic. Record the average of the two measurements. To check your blood pressure when you are not at a hospital or clinic, you can use: ? An automated blood pressure machine at a pharmacy. ? A home blood pressure monitor.  If you are between 90 years and 80 years old, ask your health care provider if you should take aspirin to prevent strokes.  Have regular diabetes screenings. This involves taking a blood sample to check your fasting blood sugar level. ? If you are at a normal weight and have a low risk for diabetes, have this test once every three years after 54 years of age. ? If you are overweight and have a high risk for diabetes, consider being tested at a younger age or more often. Preventing infection Hepatitis B  If you have a higher risk for hepatitis B, you should be screened for this virus. You are considered at high risk for hepatitis B if: ? You were born in a country where hepatitis B is common. Ask your health care provider which countries are considered high risk. ? Your parents were born in a high-risk country, and you have not been immunized against hepatitis B (hepatitis B vaccine). ? You have HIV or AIDS. ? You use needles to inject street drugs. ? You live with someone who has hepatitis B. ? You have had sex with someone who has hepatitis B. ? You get hemodialysis  treatment. ? You take certain medicines for conditions, including cancer, organ transplantation, and autoimmune conditions.  Hepatitis C  Blood testing is recommended for: ? Everyone born from [birth year] through 1967. ? Anyone with known risk factors for hepatitis C.  Sexually transmitted infections (STIs)  You should be screened for sexually transmitted infections (STIs) including gonorrhea and chlamydia if: ? You are sexually active and are younger than 54 years  of age. ? You are older than 54 years of age and your health care provider tells you that you are at risk for this type of infection. ? Your sexual activity has changed since you were last screened and you are at an increased risk for chlamydia or gonorrhea. Ask your health care provider if you are at risk.  If you do not have HIV, but are at risk, it may be recommended that you take a prescription medicine daily to prevent HIV infection. This is called pre-exposure prophylaxis (PrEP). You are considered at risk if: ? You are sexually active and do not regularly use condoms or know the HIV status of your partner(s). ? You take drugs by injection. ? You are sexually active with a partner who has HIV.  Talk with your health care provider about whether you are at high risk of being infected with HIV. If you choose to begin PrEP, you should first be tested for HIV. You should then be tested every 3 months for as long as you are taking PrEP. Pregnancy  If you are premenopausal and you may become pregnant, ask your health care provider about preconception counseling.  If you may become pregnant, take 400 to 800 micrograms (mcg) of folic acid every day.  If you want to prevent pregnancy, talk to your health care provider about birth control (contraception). Osteoporosis and menopause  Osteoporosis is a disease in which the bones lose minerals and strength with aging. This can result in serious bone fractures. Your risk for osteoporosis  can be identified using a bone density scan.  If you are 45 years of age or older, or if you are at risk for osteoporosis and fractures, ask your health care provider if you should be screened.  Ask your health care provider whether you should take a calcium or vitamin D supplement to lower your risk for osteoporosis.  Menopause may have certain physical symptoms and risks.  Hormone replacement therapy may reduce some of these symptoms and risks. Talk to your health care provider about whether hormone replacement therapy is right for you. Follow these instructions at home:  Schedule regular health, dental, and eye exams.  Stay current with your immunizations.  Do not use any tobacco products including cigarettes, chewing tobacco, or electronic cigarettes.  If you are pregnant, do not drink alcohol.  If you are breastfeeding, limit how much and how often you drink alcohol.  Limit alcohol intake to no more than 1 drink per day for nonpregnant women. One drink equals 12 ounces of beer, 5 ounces of wine, or 1 ounces of hard liquor.  Do not use street drugs.  Do not share needles.  Ask your health care provider for help if you need support or information about quitting drugs.  Tell your health care provider if you often feel depressed.  Tell your health care provider if you have ever been abused or do not feel safe at home. This information is not intended to replace advice given to you by your health care provider. Make sure you discuss any questions you have with your health care provider. Document Released: 05/08/2011 Document Revised: 03/30/2016 Document Reviewed: 07/27/2015 Elsevier Interactive Patient Education  [employer].

## 2018-01-26 NOTE — Progress Notes (Signed)
Sharon [name]  MRN: [id number] DOB: 22-Aug-1966  PCP: Mancel Bale, PA-C  Subjective:  Pt presents to clinic for a CPE.  Last dental exam: every year Last vision exam: never Last pap: 2-3 years ago Last mammo: within last 5 years - plans to have another one but she is having breast tenderness so when she called to make the appt they would not allow her to Last colonoscopy:  never  Trouble with breast pain - worse on the right side - has not had a bra fitting in a while - this has come and gone for the last several months - she has had it for 2 months this most recent time - she is not sure if it is cyclical but wonders if it might be - she has not had a menses in 5-6 months  Typical meals for patient: 2 meals, some snacks, most of meals are out to eat Typical beverage choices: water, coffee (full of sugar and cream) Exercises: none outside of work - wants to start to walk Sleeps: 4 hrs per night - falls asleep but has trouble staying asleep   Patient Active Problem List   Diagnosis Date Noted  . HSV (herpes simplex virus) infection- gets outbreask in vaginal area 1-2 times a year - would like something to help - she has been on something in the past but can not remember the name 01/26/2018  . Diverticulosis 06/21/2014  . Fibroid, uterine 06/21/2014  . GERD 08/06/2009  . MORBID OBESITY 05/26/2008  . Essential hypertension - takes medication regularly - does not monitor BP at home 02/19/2007    Review of Systems  Constitutional: Positive for fatigue (esp with her allergies).  HENT: Negative.   Eyes: Negative.   Respiratory: Negative.   Cardiovascular: Negative.   Gastrointestinal: Negative.   Endocrine: Negative.   Genitourinary: Negative.  Negative for menstrual problem (last menses `08/2017).       Right breast tenderness, feels something in the right breast - certain bras are irritating to that area  Musculoskeletal: Negative.   Skin: Positive for rash (dark spots  around her mouth - they do not hurt - she has had a cream in the past but she does not know what it is - covers it with makeup).  Allergic/Immunologic: Positive for environmental allergies.  Neurological: Negative.   Hematological: Negative.   Psychiatric/Behavioral: Negative.      Current Outpatient Medications on File Prior to Visit  Medication Sig Dispense Refill  . cetirizine (ZYRTEC) 10 MG tablet Take 1 tablet (10 mg total) by mouth daily. 30 tablet 11  . fluticasone (FLONASE) 50 MCG/ACT nasal spray Place 2 sprays into both nostrils daily. 9.9 g 5  . olopatadine (PATANOL) 0.1 % ophthalmic solution Place 1 drop into both eyes 2 (two) times daily. 5 mL 5  . loratadine (CLARITIN) 10 MG tablet Take 1 tablet (10 mg total) by mouth daily. 90 tablet 3   No current facility-administered medications on file prior to visit.     Allergies  Allergen Reactions  . Codeine Nausea And Vomiting and Other (See Comments)    Light headedness    Social History   Socioeconomic History  . Marital status: Single    Spouse name: Not on file  . Number of children: Not on file  . Years of education: Not on file  . Highest education level: Not on file  Occupational History  . Occupation: mail carrier    Fish farm manager: [employer]  Social  Needs  . Financial resource strain: Not on file  . Food insecurity:    Worry: Not on file    Inability: Not on file  . Transportation needs:    Medical: Not on file    Non-medical: Not on file  Tobacco Use  . Smoking status: Never Smoker  . Smokeless tobacco: Never Used  Substance and Sexual Activity  . Alcohol use: No    Alcohol/week: 0.0 oz  . Drug use: No  . Sexual activity: Yes    Birth control/protection: None  Lifestyle  . Physical activity:    Days per week: Not on file    Minutes per session: Not on file  . Stress: Not on file  Relationships  . Social connections:    Talks on phone: Not on file    Gets together: Not on file    Attends religious  service: Not on file    Active member of club or organization: Not on file    Attends meetings of clubs or organizations: Not on file    Relationship status: Not on file  Other Topics Concern  . Not on file  Social History Narrative   Lives alone - son will sometimes stay with her      Mail carrier    Past Surgical History:  Procedure Laterality Date  . BREAST SURGERY      Family History  Problem Relation Age of Onset  . Diabetes Mother   . Hypertension Father   . Hypertension Sister      Objective:  BP (!) 160/90   Pulse 64   Temp 98.8 F (37.1 C) (Oral)   Resp 18   Ht 5' 4.17" (1.63 m)   Wt 226 lb 12.8 oz (102.9 kg)   SpO2 99%   BMI 38.72 kg/m   Physical Exam  Constitutional: She is oriented to person, place, and time and well-developed, well-nourished, and in no distress.  HENT:  Head: Normocephalic and atraumatic.  Right Ear: Hearing, tympanic membrane and external ear normal. A foreign body (cerumen impaction - soft wax) is present.  Left Ear: Hearing, tympanic membrane, external ear and ear canal normal.  Nose: Nose normal.  Mouth/Throat: Uvula is midline, oropharynx is clear and moist and mucous membranes are normal.  Eyes: Pupils are equal, round, and reactive to light. Conjunctivae and EOM are normal.  Neck: Trachea normal and normal range of motion. Neck supple. No thyroid mass and no thyromegaly present.  Cardiovascular: Normal rate, regular rhythm and normal heart sounds.  No murmur heard. Pulmonary/Chest: Effort normal and breath sounds normal. She has no wheezes. Right breast exhibits mass (palpable abnl areas that pt is worried about - not discrete lumps more fibrous feeling) and tenderness. Right breast exhibits no inverted nipple, no nipple discharge and no skin change (lateral breast - pt cringes with palpation of that area). Left breast exhibits tenderness (lateral breast). Left breast exhibits no inverted nipple, no mass and no skin change. Breasts  are symmetrical.  Abdominal: Soft. Bowel sounds are normal. There is no tenderness.  Genitourinary: Vagina normal, uterus normal, cervix normal, right adnexa normal, left adnexa normal and vulva normal.  Genitourinary Comments: Pale vaginal tissue  Musculoskeletal: Normal range of motion.  Lymphadenopathy:    She has no cervical adenopathy.  Neurological: She is alert and oriented to person, place, and time. She has normal motor skills, normal sensation, normal strength and normal reflexes. Gait normal.  Skin: Skin is warm and dry.  No hyperpigmentation seen -  has on makeup  Psychiatric: Mood, memory, affect and judgment normal.    Wt Readings from Last 3 Encounters:  01/26/18 226 lb 12.8 oz (102.9 kg)  08/10/17 230 lb (104.3 kg)  02/19/17 226 lb (102.5 kg)     Visual Acuity Screening   Right eye Left eye Both eyes  Without correction: 20/20 20/20-2 20/13  With correction:      Results for orders placed or performed in visit on 01/26/18  POCT Wet + KOH Prep  Result Value Ref Range   Yeast by KOH Absent Absent   Yeast by wet prep Absent Absent   WBC by wet prep None (A) Few   Clue Cells Wet Prep HPF POC None None   Trich by wet prep Absent Absent   Bacteria Wet Prep HPF POC None (A) Few   Epithelial Cells By [employer] ([facility]) None None, Few, Too numerous to count   RBC,UR,HPF,POC None None RBC/hpf    Assessment and Plan :  Annual physical exam- anticipatory guidance given  Encounter for gynecological examination without abnormal finding - Plan: Pap IG, CT/NG NAA, and HPV (high risk) - check labs  Screen for colon cancer - Plan: Cologuard -   Encounter for hepatitis C screening test for low risk patient - Plan: HCV Ab w Reflex to Quant PCR  Need for immunization against tetanus alone - Plan: Td vaccine greater than or equal to 7yo preservative free IM  Screening for deficiency anemia - Plan: CBC with Differential/Platelet  Essential hypertension - Plan: CMP14+EGFR,  lisinopril-hydrochlorothiazide (PRINZIDE,ZESTORETIC) 20-25 MG tablet, amLODipine (NORVASC) 5 MG tablet - pt is not well controlled - she has not had her norvasc today but I suspect that would not have made a difference - we will increase the dose from 2.73m to 535mand recheck in 6 weeks  MORBID OBESITY - Plan: Hemoglobin A1c  Seasonal allergic rhinitis, unspecified trigger  Screening cholesterol level - Plan: Lipid panel  Encounter for vitamin deficiency screening  Fatigue, unspecified type - Plan: VITAMIN D 25 Hydroxy (Vit-D Deficiency, Fractures) - likely related to sleep disturbance  Encounter for screening for HIV - Plan: HIV antibody  Screen for STD (sexually transmitted disease) - Plan: RPR, POCT Wet + KOH Prep - sexually active with one partner but wants to check and make sure  Vaginal dryness - Plan: POCT Wet + KOH Prep - likely related to decreasing hormones - she was encouraged to use lubricant  Breast pain - Plan: MM [employer] Bil[employer], [employer] - suspect related to hormones but now that the patient does not have a period is not aware of her cycling hormones.  This has been going on for a little bit so I do not want to wait and let her monitor this - we will get imaging at this time  Other seasonal allergic rhinitis - Plan: montelukast (SINGULAIR) 10 MG tablet  Sleep disturbance - OTC supplements suggested  HSV (herpes simplex virus) infection - Plan: valACYclovir (VALTREX) 500 MG tablet  Hyperpigmentation - Plan: Ambulatory referral to Dermatology    SaWindell HummingbirdA-C  [facility] [place]/23/2019 2:34 PM

## 2018-01-29 LAB — CBC WITH DIFFERENTIAL/PLATELET
BASOS ABS: 0 10*3/uL (ref 0.0–0.2)
Basos: 0 %
EOS (ABSOLUTE): 0.1 10*3/uL (ref 0.0–0.4)
Eos: 2 %
HEMOGLOBIN: 13.2 g/dL (ref 11.1–15.9)
Hematocrit: 39.5 % (ref 34.0–46.6)
IMMATURE GRANS (ABS): 0 10*3/uL (ref 0.0–0.1)
Immature Granulocytes: 0 %
Lymphocytes Absolute: 1.9 10*3/uL (ref 0.7–3.1)
Lymphs: 38 %
MCH: 27.2 pg (ref 26.6–33.0)
MCHC: 33.4 g/dL (ref 31.5–35.7)
MCV: 81 fL (ref 79–97)
MONOCYTES: 7 %
Monocytes Absolute: 0.3 10*3/uL (ref 0.1–0.9)
Neutrophils Absolute: 2.7 10*3/uL (ref 1.4–7.0)
Neutrophils: 53 %
PLATELETS: 264 10*3/uL (ref 150–379)
RBC: 4.86 x10E6/uL (ref 3.77–5.28)
RDW: 15.1 % (ref 12.3–15.4)
WBC: 5.1 10*3/uL (ref 3.4–10.8)

## 2018-01-29 LAB — CMP14+EGFR
ALBUMIN: 4.4 g/dL (ref 3.5–5.5)
ALK PHOS: 71 IU/L (ref 39–117)
ALT: 19 IU/L (ref 0–32)
AST: 19 IU/L (ref 0–40)
Albumin/Globulin Ratio: 1.6 (ref 1.2–2.2)
BUN / CREAT RATIO: 15 (ref 9–23)
BUN: 13 mg/dL (ref 6–24)
Bilirubin Total: <0.2 mg/dL (ref 0.0–1.2)
CO2: 18 mmol/L — AB (ref 20–29)
CREATININE: 0.86 mg/dL (ref 0.57–1.00)
Calcium: 9.9 mg/dL (ref 8.7–10.2)
Chloride: 103 mmol/L (ref 96–106)
GFR calc Af Amer: 89 mL/min/{1.73_m2} (ref >59)
GFR calc non Af Amer: 77 mL/min/{1.73_m2} (ref >59)
GLUCOSE: 93 mg/dL (ref 65–99)
Globulin, Total: 2.7 g/dL (ref 1.5–4.5)
Potassium: 3.8 mmol/L (ref 3.5–5.2)
Sodium: 141 mmol/L (ref 134–144)
TOTAL PROTEIN: 7.1 g/dL (ref 6.0–8.5)

## 2018-01-29 LAB — LIPID PANEL
CHOL/HDL RATIO: 2.8 ratio (ref 0.0–4.4)
CHOLESTEROL TOTAL: 201 mg/dL — AB (ref 100–199)
HDL: 73 mg/dL (ref >39)
LDL Calculated: 116 mg/dL — ABNORMAL HIGH (ref 0–99)
TRIGLYCERIDES: 60 mg/dL (ref 0–149)
VLDL Cholesterol Cal: 12 mg/dL (ref 5–40)

## 2018-01-29 LAB — HEMOGLOBIN A1C
Est. average glucose Bld gHb Est-mCnc: 131 mg/dL
Hgb A1c MFr Bld: 6.2 % — ABNORMAL HIGH (ref 4.8–5.6)

## 2018-01-29 LAB — HCV INTERPRETATION

## 2018-01-29 LAB — HIV ANTIBODY (ROUTINE TESTING W REFLEX): HIV Screen 4th Generation wRfx: NONREACTIVE

## 2018-01-29 LAB — VITAMIN D 25 HYDROXY (VIT D DEFICIENCY, FRACTURES): Vit D, 25-Hydroxy: 33 ng/mL (ref 30.0–100.0)

## 2018-01-29 LAB — RPR: RPR: NONREACTIVE

## 2018-01-29 LAB — HCV AB W REFLEX TO QUANT PCR

## 2018-01-30 ENCOUNTER — Other Ambulatory Visit: Payer: Self-pay

## 2018-01-30 ENCOUNTER — Telehealth: Payer: Self-pay | Admitting: Physician Assistant

## 2018-01-30 ENCOUNTER — Ambulatory Visit: Payer: Self-pay

## 2018-01-30 DIAGNOSIS — J302 Other seasonal allergic rhinitis: Secondary | ICD-10-CM

## 2018-01-30 DIAGNOSIS — J3489 Other specified disorders of nose and nasal sinuses: Secondary | ICD-10-CM

## 2018-01-30 DIAGNOSIS — R220 Localized swelling, mass and lump, head: Principal | ICD-10-CM

## 2018-01-30 MED ORDER — FLUTICASONE PROPIONATE 50 MCG/ACT NA SUSP: 2.0000 | Freq: Every day | NASAL | 5 refills | Status: DC

## 2018-01-30 NOTE — Telephone Encounter (Signed)
Copied from [place] [id number]. Topic: Quick Communication - See Telephone Encounter >> Jan 30, 2018  8:43 AM Corie Chiquito, NT wrote: CRM for notification. Patient calling because she has some questions about her getting an colonoscopy done and dropping off a stool sample that her and Ms.Weber dicussed. If someone could give her a call back about this at  [phone]

## 2018-01-30 NOTE — Telephone Encounter (Signed)
Please schedule for OV as this pt was seen for physical on 3/23

## 2018-01-30 NOTE — Telephone Encounter (Signed)
I advised pt on cologuard test ordered. However, I noticed that pt had [employer]. Just wanted to check with you and make sure that it will be covered since [employer] is usually one of the companies that we cannot use cologuard for.

## 2018-01-30 NOTE — Telephone Encounter (Signed)
Pt calling with c/o congested nose with post nasal drip and small amount of clear nasal drainage. Pt c/o headache toward the front of her head. Pt c/o occasional NP cough, hoarseness. Pt c/o slight earache to the right ear (pt stated she was told by the MD that she has wax in that ear and to take peroxide to clean it out but has not done that yet. Pt has tried Singulair and feels it has helped somewhat. Pt missed work today.  Pt denies fever. Sending note back to office. Pt was seen 01/26/18 in office.   Reason for Disposition . [1] Sinus congestion (pressure, fullness) AND [2] present > 10 days  Additional Information . Negative: Earache    Wax in ear per MD on Saturday  Answer Assessment - Initial Assessment Questions 1. LOCATION: "Where does it hurt?"      Headache in the front of head when congested 2. ONSET: "When did the sinus pain start?"  (e.g., hours, days)      1 weeks ago but got worse on Saturday 3. SEVERITY: "How bad is the pain?"   (Scale 1-10; mild, moderate or severe)   - MILD (1-3): doesn't interfere with normal activities    - MODERATE (4-7): interferes with normal activities (e.g., work or school) or awakens from sleep   - SEVERE (8-10): excruciating pain and patient unable to do any normal activities        Moderate missed work today 4. RECURRENT SYMPTOM: "Have you ever had sinus problems before?" If so, ask: "When was the last time?" and "What happened that time?"      Yes-last year-was given abx and Flonase and Claritin- has ears cleaned from wax 5. NASAL CONGESTION: "Is the nose blocked?" If so, ask, "Can you open it or must you breathe through the mouth?"     Blocked off and on - yes 6. NASAL DISCHARGE: "Do you have discharge from your nose?" If so ask, "What color?"     Yes-clear 7. FEVER: "Do you have a fever?" If so, ask: "What is it, how was it measured, and when did it start?"      no 8. OTHER SYMPTOMS: "Do you have any other symptoms?" (e.g., sore throat,  cough, earache, difficulty breathing)     Slight sore throat, occasional cough, right earache, post nasal drip, sloight sore throat (thinks Singulair has helped the phlegm, hoarse 9. PREGNANCY: "Is there any chance you are pregnant?" "When was your last menstrual period?"     n/a  Protocols used: SINUS PAIN OR CONGESTION-A-AH

## 2018-01-30 NOTE — Telephone Encounter (Signed)
Copied from [place] [id number]. Topic: Quick Communication - Lab Results >> Jan 30, 2018  8:28 AM Corie Chiquito, NT wrote: Patient calling to check the status of her lab results. She would like a call back when they are ready for her. She can be reached at [phone]

## 2018-01-30 NOTE — Telephone Encounter (Signed)
Please advise on labs.

## 2018-01-31 LAB — PAP IG, CT-NG NAA, HPV HIGH-RISK
Chlamydia, Nuc. Acid Amp: NEGATIVE
Gonococcus by Nucleic Acid Amp: NEGATIVE
PAP SMEAR COMMENT: 0

## 2018-01-31 LAB — HPV, LOW VOLUME (REFLEX): HPV low volume reflex: NEGATIVE

## 2018-02-01 NOTE — Telephone Encounter (Signed)
Can you call the patient and let her know this.  If they do not cover then let her know I will order referral for colonoscopy.  Thanks

## 2018-02-01 NOTE — Telephone Encounter (Signed)
As far as I know [employer] is one of the insurance companies that may or may not cover. We always advise these patients to call their insurance company to ensure coverage when it comes to them.

## 2018-02-01 NOTE — Telephone Encounter (Signed)
See below

## 2018-02-01 NOTE — Telephone Encounter (Signed)
[employer] does not cover cologuard?

## 2018-02-01 NOTE — Telephone Encounter (Signed)
Pt advised to contact insurance regarding coverage.

## 2018-02-05 ENCOUNTER — Encounter: Payer: Self-pay | Admitting: Physician Assistant

## 2018-02-09 ENCOUNTER — Other Ambulatory Visit: Payer: Self-pay | Admitting: Physician Assistant

## 2018-02-09 DIAGNOSIS — N644 Mastodynia: Secondary | ICD-10-CM

## 2018-02-15 ENCOUNTER — Other Ambulatory Visit: Payer: Self-pay | Admitting: Physician Assistant

## 2018-02-15 ENCOUNTER — Ambulatory Visit
Admission: RE | Admit: 2018-02-15 | Discharge: 2018-02-15 | Disposition: A | Discharge status: Home / Self Care | Payer: Federal, State, Local not specified - PPO | Source: Ambulatory Visit | Attending: Physician Assistant | Admitting: Physician Assistant

## 2018-02-15 ENCOUNTER — Ambulatory Visit: Admission: RE | Admit: 2018-02-15 | Payer: Federal, State, Local not specified - PPO | Source: Ambulatory Visit

## 2018-02-15 DIAGNOSIS — R921 Mammographic calcification found on diagnostic imaging of breast: Secondary | ICD-10-CM | POA: Diagnosis not present

## 2018-02-15 DIAGNOSIS — N644 Mastodynia: Secondary | ICD-10-CM | POA: Diagnosis not present

## 2018-02-15 DIAGNOSIS — N6489 Other specified disorders of breast: Secondary | ICD-10-CM | POA: Diagnosis not present

## 2018-02-15 DIAGNOSIS — R928 Other abnormal and inconclusive findings on diagnostic imaging of breast: Secondary | ICD-10-CM | POA: Diagnosis not present

## 2018-02-21 ENCOUNTER — Other Ambulatory Visit: Payer: Self-pay

## 2018-02-21 ENCOUNTER — Ambulatory Visit (INDEPENDENT_AMBULATORY_CARE_PROVIDER_SITE_OTHER): Payer: Federal, State, Local not specified - PPO

## 2018-02-21 ENCOUNTER — Ambulatory Visit: Payer: Federal, State, Local not specified - PPO | Admitting: Emergency Medicine

## 2018-02-21 ENCOUNTER — Encounter: Payer: Self-pay | Admitting: Emergency Medicine

## 2018-02-21 VITALS — BP 140/82 | HR 68 | Temp 98.8°F | Resp 16 | Ht 64.0 in | Wt 225.2 lb

## 2018-02-21 DIAGNOSIS — M778 Other enthesopathies, not elsewhere classified: Secondary | ICD-10-CM

## 2018-02-21 DIAGNOSIS — M7581 Other shoulder lesions, right shoulder: Secondary | ICD-10-CM | POA: Diagnosis not present

## 2018-02-21 DIAGNOSIS — M62838 Other muscle spasm: Secondary | ICD-10-CM | POA: Diagnosis not present

## 2018-02-21 DIAGNOSIS — M7918 Myalgia, other site: Secondary | ICD-10-CM

## 2018-02-21 DIAGNOSIS — M25511 Pain in right shoulder: Secondary | ICD-10-CM | POA: Diagnosis not present

## 2018-02-21 MED ORDER — DICLOFENAC SODIUM 75 MG PO TBEC: 75.0000 mg | DELAYED_RELEASE_TABLET | Freq: Two times a day (BID) | ORAL | 0 refills | Status: AC

## 2018-02-21 MED ORDER — CYCLOBENZAPRINE HCL 10 MG PO TABS: 10.0000 mg | ORAL_TABLET | Freq: Three times a day (TID) | ORAL | 0 refills | Status: DC | PRN

## 2018-02-21 NOTE — Patient Instructions (Addendum)
Shoulder Pain Many things can cause shoulder pain, including:  An injury.  Moving the arm in the same way again and again (overuse).  Joint pain (arthritis).  Follow these instructions at home: Take these actions to help with your pain:  Squeeze a soft ball or a foam pad as much as you can. This helps to prevent swelling. It also makes the arm stronger.  Take over-the-counter and prescription medicines only as told by your doctor.  If told, put ice on the area: ? Put ice in a plastic bag. ? Place a towel between your skin and the bag. ? Leave the ice on for 20 minutes, 2-3 times per day. Stop putting on ice if it does not help with the pain.  If you were given a shoulder sling or immobilizer: ? Wear it as told. ? Remove it to shower or bathe. ? Move your arm as little as possible. ? Keep your hand moving. This helps prevent swelling.  Contact a doctor if:  Your pain gets worse.  Medicine does not help your pain.  You have new pain in your arm, hand, or fingers. Get help right away if:  Your arm, hand, or fingers: ? Tingle. ? Are numb. ? Are swollen. ? Are painful. ? Turn white or blue. This information is not intended to replace advice given to you by your health care provider. Make sure you discuss any questions you have with your health care provider. Document Released: 04/10/2008 Document Revised: 06/18/2016 Document Reviewed: 02/15/2015 Elsevier Interactive Patient Education  2018 [employer].     IF you received an x-ray today, you will receive an invoice from [facility]. Please contact [facility] at [phone] with questions or concerns regarding your invoice.   IF you received labwork today, you will receive an invoice from [place]. Please contact [facility] at [phone] with questions or concerns regarding your invoice.   Our billing staff will not be able to assist you with questions regarding bills from these companies.  You  will be contacted with the lab results as soon as they are available. The fastest way to get your results is to activate your My Chart account. Instructions are located on the last page of this paperwork. If you have not heard from [place] regarding the results in 2 weeks, please contact this office.        IF you received an x-ray today, you will receive an invoice from [facility]. Please contact [facility] at [phone] with questions or concerns regarding your invoice.   IF you received labwork today, you will receive an invoice from [place]. Please contact [facility] at [phone] with questions or concerns regarding your invoice.   Our billing staff will not be able to assist you with questions regarding bills from these companies.  You will be contacted with the lab results as soon as they are available. The fastest way to get your results is to activate your My Chart account. Instructions are located on the last page of this paperwork. If you have not heard from [place] regarding the results in 2 weeks, please contact this office.

## 2018-02-21 NOTE — Progress Notes (Signed)
Sharon [name] 52 y.o.   Chief Complaint  Patient presents with  . Shoulder Pain    RIGHT since yesterday-per patient unable to lift it up    HISTORY OF PRESENT ILLNESS: This is a 52 y.o. female complaining of right shoulder pain since yesterday.  May have hurt it the day before while moving a box spring and mattress.  Unable to lift it up.  HPI   Prior to Admission medications   Medication Sig Start Date End Date Taking? Authorizing Provider  amLODipine (NORVASC) 5 MG tablet Take 1 tablet (5 mg total) by mouth daily. 01/26/18   Weber, Damaris Hippo, PA-C  cetirizine (ZYRTEC) 10 MG tablet Take 1 tablet (10 mg total) by mouth daily. 02/19/17   Ivar Drape D, PA  fluticasone (FLONASE) 50 MCG/ACT nasal spray Place 2 sprays into both nostrils daily. 01/30/18   Ivar Drape D, PA  lisinopril-hydrochlorothiazide (PRINZIDE,ZESTORETIC) 20-25 MG tablet Take 1 tablet by mouth daily. 01/26/18   Weber, Damaris Hippo, PA-C  loratadine (CLARITIN) 10 MG tablet Take 1 tablet (10 mg total) by mouth daily. 10/27/16 10/27/17  Jaynee Eagles, PA-C  montelukast (SINGULAIR) 10 MG tablet Take 1 tablet (10 mg total) by mouth at bedtime. 01/26/18   Weber, Damaris Hippo, PA-C  olopatadine (PATANOL) 0.1 % ophthalmic solution Place 1 drop into both eyes 2 (two) times daily. 02/19/17   Ivar Drape D, PA  valACYclovir (VALTREX) 500 MG tablet 1 po bid for 3 days prn outbreak 01/26/18   Mancel Bale, PA-C    Allergies  Allergen Reactions  . Codeine Nausea And Vomiting and Other (See Comments)    Light headedness    Patient Active Problem List   Diagnosis Date Noted  . HSV (herpes simplex virus) infection 01/26/2018  . Diverticulosis 06/21/2014  . Fibroid, uterine 06/21/2014  . GERD 08/06/2009  . MORBID OBESITY 05/26/2008  . Essential hypertension 02/19/2007    Past Medical History:  Diagnosis Date  . Allergy   . Hypertension   . Seasonal allergies     Past Surgical History:  Procedure Laterality Date    . BREAST EXCISIONAL BIOPSY Bilateral 1978  . BREAST SURGERY      Social History   Socioeconomic History  . Marital status: Single    Spouse name: Not on file  . Number of children: Not on file  . Years of education: Not on file  . Highest education level: Not on file  Occupational History  . Occupation: mail carrier    Employer: [employer]  Social Needs  . Financial resource strain: Not on file  . Food insecurity:    Worry: Not on file    Inability: Not on file  . Transportation needs:    Medical: Not on file    Non-medical: Not on file  Tobacco Use  . Smoking status: Never Smoker  . Smokeless tobacco: Never Used  Substance and Sexual Activity  . Alcohol use: No    Alcohol/week: 0.0 oz  . Drug use: No  . Sexual activity: Yes    Birth control/protection: None  Lifestyle  . Physical activity:    Days per week: Not on file    Minutes per session: Not on file  . Stress: Not on file  Relationships  . Social connections:    Talks on phone: Not on file    Gets together: Not on file    Attends religious service: Not on file    Active member of club or organization: Not on  file    Attends meetings of clubs or organizations: Not on file    Relationship status: Not on file  . Intimate partner violence:    Fear of current or ex partner: Not on file    Emotionally abused: Not on file    Physically abused: Not on file    Forced sexual activity: Not on file  Other Topics Concern  . Not on file  Social History Narrative   Lives alone - son will sometimes stay with her      Mail carrier    Family History  Problem Relation Age of Onset  . Diabetes Mother   . Hypertension Father   . Hypertension Sister      Review of Systems  Constitutional: Negative.  Negative for chills and fever.  Respiratory: Negative.  Negative for cough and shortness of breath.   Cardiovascular: Negative.  Negative for chest pain and palpitations.  Gastrointestinal: Negative.  Negative for  abdominal pain, nausea and vomiting.  Musculoskeletal: Positive for joint pain (Right shoulder).  Skin: Negative.  Negative for rash.  Neurological: Negative.  Negative for dizziness and headaches.  Endo/Heme/Allergies: Negative.   All other systems reviewed and are negative.  Vitals:   02/21/18 1452  BP: 140/82  Pulse: 68  Resp: 16  Temp: 98.8 F (37.1 C)  SpO2: 98%     Physical Exam  Constitutional: She is oriented to person, place, and time. She appears well-developed and well-nourished.  HENT:  Head: Normocephalic and atraumatic.  Eyes: Pupils are equal, round, and reactive to light. EOM are normal.  Neck: Normal range of motion. Neck supple.  Cardiovascular: Normal rate and regular rhythm.  Pulmonary/Chest: Effort normal and breath sounds normal.  Musculoskeletal:  Right shoulder: Positive tenderness.  No bruising or erythema.  Limited range of motion due to pain. Right upper extremity: NVI.  Neurological: She is alert and oriented to person, place, and time. She exhibits normal muscle tone.  Skin: Skin is warm and dry. Capillary refill takes less than 2 seconds. No rash noted.  Psychiatric: She has a normal mood and affect. Her behavior is normal.  Vitals reviewed.  Dg Shoulder Right  Result Date: 02/21/2018 CLINICAL DATA:  Right shoulder pain. No known injury. Symptoms are chronic. EXAM: RIGHT SHOULDER - 2+ VIEW COMPARISON:  None. FINDINGS: No acute bony or joint abnormality is identified. The patient has mild acromioclavicular osteoarthritis. Degenerative cyst in the humeral head may be related to rotator cuff tendinopathy. The glenohumeral joint appears normal. IMPRESSION: No acute finding. Mild to moderate acromioclavicular osteoarthritis. Degenerative cyst in the humeral head may be related to rotator cuff tendinopathy. Electronically Signed   By: Inge Rise M.D.   On: 02/21/2018 15:35    A total of 25 minutes was spent in the room with the patient, greater  than 50% of which was in counseling/coordination of care.  ASSESSMENT & PLAN: Sharon [name] was seen today for shoulder pain.  Diagnoses and all orders for this visit:  Acute pain of right shoulder -     DG Shoulder Right; Future -     diclofenac (VOLTAREN) 75 MG EC tablet; Take 1 tablet (75 mg total) by mouth 2 (two) times daily for 5 days. -     cyclobenzaprine (FLEXERIL) 10 MG tablet; Take 1 tablet (10 mg total) by mouth 3 (three) times daily as needed for muscle spasms.  Right shoulder tendinitis  Musculoskeletal pain  Muscle spasm     Patient Instructions   Shoulder Pain  Many things can cause shoulder pain, including:  An injury.  Moving the arm in the same way again and again (overuse).  Joint pain (arthritis).  Follow these instructions at home: Take these actions to help with your pain:  Squeeze a soft ball or a foam pad as much as you can. This helps to prevent swelling. It also makes the arm stronger.  Take over-the-counter and prescription medicines only as told by your doctor.  If told, put ice on the area: ? Put ice in a plastic bag. ? Place a towel between your skin and the bag. ? Leave the ice on for 20 minutes, 2-3 times per day. Stop putting on ice if it does not help with the pain.  If you were given a shoulder sling or immobilizer: ? Wear it as told. ? Remove it to shower or bathe. ? Move your arm as little as possible. ? Keep your hand moving. This helps prevent swelling.  Contact a doctor if:  Your pain gets worse.  Medicine does not help your pain.  You have new pain in your arm, hand, or fingers. Get help right away if:  Your arm, hand, or fingers: ? Tingle. ? Are numb. ? Are swollen. ? Are painful. ? Turn white or blue. This information is not intended to replace advice given to you by your health care provider. Make sure you discuss any questions you have with your health care provider. Document Released: 04/10/2008 Document Revised:  06/18/2016 Document Reviewed: 02/15/2015 Elsevier Interactive Patient Education  2018 [employer].     IF you received an x-ray today, you will receive an invoice from [facility]. Please contact [facility] Radiology at [phone] with questions or concerns regarding your invoice.   IF you received labwork today, you will receive an invoice from [place]. Please contact [facility] at [phone] with questions or concerns regarding your invoice.   Our billing staff will not be able to assist you with questions regarding bills from these companies.  You will be contacted with the lab results as soon as they are available. The fastest way to get your results is to activate your My Chart account. Instructions are located on the last page of this paperwork. If you have not heard from [place] regarding the results in 2 weeks, please contact this office.        IF you received an x-ray today, you will receive an invoice from [facility]. Please contact [facility] Radiology at [phone] with questions or concerns regarding your invoice.   IF you received labwork today, you will receive an invoice from [place]. Please contact [facility] at [phone] with questions or concerns regarding your invoice.   Our billing staff will not be able to assist you with questions regarding bills from these companies.  You will be contacted with the lab results as soon as they are available. The fastest way to get your results is to activate your My Chart account. Instructions are located on the last page of this paperwork. If you have not heard from [place] regarding the results in 2 weeks, please contact this office.         Agustina Caroli, MD [facility]

## 2018-03-05 ENCOUNTER — Ambulatory Visit: Payer: Federal, State, Local not specified - PPO | Admitting: Physician Assistant

## 2018-03-05 ENCOUNTER — Encounter: Payer: Self-pay | Admitting: Physician Assistant

## 2018-03-05 ENCOUNTER — Other Ambulatory Visit: Payer: Self-pay

## 2018-03-05 VITALS — BP 140/88 | HR 76 | Temp 99.0°F | Resp 18 | Ht 64.0 in | Wt 225.6 lb

## 2018-03-05 DIAGNOSIS — S80862A Insect bite (nonvenomous), left lower leg, initial encounter: Secondary | ICD-10-CM | POA: Diagnosis not present

## 2018-03-05 DIAGNOSIS — L299 Pruritus, unspecified: Secondary | ICD-10-CM | POA: Diagnosis not present

## 2018-03-05 DIAGNOSIS — I10 Essential (primary) hypertension: Secondary | ICD-10-CM | POA: Diagnosis not present

## 2018-03-05 DIAGNOSIS — W57XXXA Bitten or stung by nonvenomous insect and other nonvenomous arthropods, initial encounter: Secondary | ICD-10-CM | POA: Diagnosis not present

## 2018-03-05 MED ORDER — RANITIDINE HCL 150 MG PO TABS: 150.0000 mg | ORAL_TABLET | Freq: Two times a day (BID) | ORAL | 0 refills | Status: DC

## 2018-03-05 MED ORDER — TRIAMCINOLONE ACETONIDE 0.5 % EX CREA: 1.0000 "application " | TOPICAL_CREAM | Freq: Two times a day (BID) | CUTANEOUS | 0 refills | Status: AC

## 2018-03-05 MED ORDER — AMLODIPINE BESYLATE 10 MG PO TABS: 10.0000 mg | ORAL_TABLET | Freq: Every day | ORAL | 0 refills | Status: DC

## 2018-03-05 NOTE — Patient Instructions (Addendum)
Melatonin for sleep - to help you sleep better  [facility] for Vit B  Use your zyrtec and claritin to help with the itching  zantac 150mg  -- take 2x/day to help with itching Use the cream to help with the itching  Watch the area - it is currently 6cm by 6cm with the edge of the bruised area    IF you received an x-ray today, you will receive an invoice from [facility]. Please contact [facility] Radiology at [phone] with questions or concerns regarding your invoice.   IF you received labwork today, you will receive an invoice from [place]. Please contact [facility] at [phone] with questions or concerns regarding your invoice.   Our billing staff will not be able to assist you with questions regarding bills from these companies.  You will be contacted with the lab results as soon as they are available. The fastest way to get your results is to activate your My Chart account. Instructions are located on the last page of this paperwork. If you have not heard from [place] regarding the results in 2 weeks, please contact this office.

## 2018-03-05 NOTE — Progress Notes (Signed)
Sharon [name]  MRN: [id number] DOB: Nov 01, 1966  PCP: Mancel Bale, PA-C  Chief Complaint  Patient presents with  . Insect Bite    on left leg back of calf about an inch wide and hard in the middle x2days     Subjective:  Pt presents to clinic for area on her left lower medial calf - she felt something bite her about 4 to 5 days ago she is unsure of the exact timing of the bite and is unsure what bite her.  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  She first noticed erythema and itching 2 nights ago since then she has noticed that the redness has completely resolved the itching has improved.  Inside the center of the area where it was the most red she still notices a lump that the lump is getting smaller with time.  She has been using Benadryl to help with itching and is not really helping all that much.   She has not taken her second lead pressure medicine yet today and she takes 1 in the morning and one at night.  She does not check her blood pressure at home.  History is obtained by patient.  Review of Systems  Skin: Positive for rash (around the bite area) and wound.    Patient Active Problem List   Diagnosis Date Noted  . Acute pain of right shoulder 02/21/2018  . Right shoulder tendinitis 02/21/2018  . Musculoskeletal pain 02/21/2018  . Muscle spasm 02/21/2018  . HSV (herpes simplex virus) infection 01/26/2018  . Diverticulosis 06/21/2014  . Fibroid, uterine 06/21/2014  . GERD 08/06/2009  . MORBID OBESITY 05/26/2008  . Essential hypertension 02/19/2007    Current Outpatient Medications on File Prior to Visit  Medication Sig Dispense Refill  . cetirizine (ZYRTEC) 10 MG tablet Take 1 tablet (10 mg total) by mouth daily. 30 tablet 11  . cyclobenzaprine (FLEXERIL) 10 MG tablet Take 1 tablet (10 mg total) by mouth 3 (three) times daily as needed for muscle spasms. 30 tablet 0  . fluticasone (FLONASE) 50 MCG/ACT nasal spray Place 2 sprays into both  nostrils daily. 9.9 g 5  . lisinopril-hydrochlorothiazide (PRINZIDE,ZESTORETIC) 20-25 MG tablet Take 1 tablet by mouth daily. 90 tablet 0  . montelukast (SINGULAIR) 10 MG tablet Take 1 tablet (10 mg total) by mouth at bedtime. 90 tablet 3  . olopatadine (PATANOL) 0.1 % ophthalmic solution Place 1 drop into both eyes 2 (two) times daily. 5 mL 5  . valACYclovir (VALTREX) 500 MG tablet 1 po bid for 3 days prn outbreak 30 tablet 0  . loratadine (CLARITIN) 10 MG tablet Take 1 tablet (10 mg total) by mouth daily. 90 tablet 3   No current facility-administered medications on file prior to visit.     Allergies  Allergen Reactions  . Codeine Nausea And Vomiting and Other (See Comments)    Light headedness    Past Medical History:  Diagnosis Date  . Allergy   . Hypertension   . Seasonal allergies    Social History   Social History Narrative   Lives alone - son will sometimes stay with her      Mail carrier   Social History   Tobacco Use  . Smoking status: Never Smoker  . Smokeless tobacco: Never Used  Substance Use Topics  . Alcohol use: No    Alcohol/week: 0.0 oz  . Drug use: No   family history  includes Diabetes in her mother; Hypertension in her father and sister.     Objective:  BP 140/88   Pulse 76   Temp 99 F (37.2 C) (Oral)   Resp 18   Ht 5\' 4"  (1.626 m)   Wt 225 lb 9.6 oz (102.3 kg)   LMP 03/01/2015   SpO2 97%   BMI 38.72 kg/m  Body mass index is 38.72 kg/m.  Physical Exam  Constitutional: She is oriented to person, place, and time.  HENT:  Head: Normocephalic and atraumatic.  Right Ear: Hearing and external ear normal.  Left Ear: Hearing and external ear normal.  Eyes: Conjunctivae are normal.  Neck: Normal range of motion.  Cardiovascular: Normal rate, regular rhythm and normal heart sounds.  No murmur heard. Pulmonary/Chest: Effort normal and breath sounds normal. She has no wheezes.  Neurological: She is alert and oriented to person, place, and  time.  Skin: Skin is warm and dry. Rash (6x6cm ring of erythema with central raised tender nodule - no erthyema of skin - no warmth of skin) noted. No erythema.  Psychiatric: She has a normal mood and affect. Her behavior is normal. Judgment and thought content normal.  Vitals reviewed.   Assessment and Plan :  Insect bite of left lower leg, initial encounter - Plan: triamcinolone cream (KENALOG) 0.5 %, ranitidine (ZANTAC) 150 MG tablet -question spider bite causing symptoms though after 24 hours significant improvement.  Will do topical steroid to help with itching as well as continuing at home Zyrtec Claritin and she will add Zantac to help with the histamine response causing the itching.  I expect this to continue improving but if for any reason the rash worsens or stops improving over the next 24 to 48 hours she will recheck with me later this week.  Essential hypertension - Plan: amLODipine (NORVASC) 10 MG tablet -increase Norvasc to 10 mg nightly as she does not have controlled blood pressure on 5 mg currently.  She will recheck in 1 month for her blood pressure.    Pruritus - Plan: ranitidine (ZANTAC) 150 MG tablet  Windell Hummingbird PA-C  [facility] [place] 03/05/2018 6:44 PM

## 2018-04-20 ENCOUNTER — Other Ambulatory Visit: Payer: Self-pay | Admitting: Family Medicine

## 2018-04-20 DIAGNOSIS — I10 Essential (primary) hypertension: Secondary | ICD-10-CM

## 2018-04-22 NOTE — Telephone Encounter (Signed)
Patient called, left VM to return call to the office to schedule a BP check visit. It was noted at the last OV to return in 03/2018 for a BP check.

## 2018-04-23 ENCOUNTER — Other Ambulatory Visit: Payer: Self-pay | Admitting: Physician Assistant

## 2018-04-23 DIAGNOSIS — I10 Essential (primary) hypertension: Secondary | ICD-10-CM

## 2018-06-29 ENCOUNTER — Other Ambulatory Visit: Payer: Self-pay | Admitting: Physician Assistant

## 2018-06-29 DIAGNOSIS — B009 Herpesviral infection, unspecified: Secondary | ICD-10-CM

## 2018-06-29 DIAGNOSIS — I10 Essential (primary) hypertension: Secondary | ICD-10-CM

## 2018-07-01 NOTE — Telephone Encounter (Signed)
Left VM for pt to CB; needs F/U BP check. Was to be done 04/02/18 according to note

## 2018-07-01 NOTE — Telephone Encounter (Signed)
Refill of VAltrex  LOV 03/05/18 S. Weber  [facility] 01/26/18  #[phone] refills  [id number]  [facility]

## 2018-07-18 ENCOUNTER — Other Ambulatory Visit: Payer: Self-pay

## 2018-07-18 ENCOUNTER — Ambulatory Visit: Payer: Federal, State, Local not specified - PPO | Admitting: Physician Assistant

## 2018-07-18 ENCOUNTER — Encounter: Payer: Self-pay | Admitting: Physician Assistant

## 2018-07-18 VITALS — BP 108/72 | HR 72 | Temp 98.8°F | Resp 16 | Ht 64.0 in | Wt 223.4 lb

## 2018-07-18 DIAGNOSIS — M545 Low back pain, unspecified: Secondary | ICD-10-CM

## 2018-07-18 DIAGNOSIS — J302 Other seasonal allergic rhinitis: Secondary | ICD-10-CM | POA: Diagnosis not present

## 2018-07-18 LAB — POC MICROSCOPIC URINALYSIS (UMFC): Mucus: ABSENT

## 2018-07-18 LAB — POCT URINALYSIS DIP (MANUAL ENTRY)
BILIRUBIN UA: NEGATIVE
GLUCOSE UA: NEGATIVE mg/dL
Leukocytes, UA: NEGATIVE
NITRITE UA: NEGATIVE
Protein Ur, POC: NEGATIVE mg/dL
Spec Grav, UA: >=1.03 — AB (ref 1.010–1.025)
Urobilinogen, UA: 0.2 E.U./dL
pH, UA: 5.5 (ref 5.0–8.0)

## 2018-07-18 MED ORDER — NAPROXEN 500 MG PO TABS
500.0000 mg | ORAL_TABLET | Freq: Two times a day (BID) | ORAL | 0 refills | Status: DC
Start: 2018-07-18 — End: 2022-11-09

## 2018-07-18 MED ORDER — CYCLOBENZAPRINE HCL 5 MG PO TABS: 5.0000 mg | ORAL_TABLET | Freq: Three times a day (TID) | ORAL | 0 refills | Status: DC | PRN

## 2018-07-18 NOTE — Patient Instructions (Addendum)
[facility]. [email or web address] Address: [address], [place], [place] [address] Phone: [phone]  Opchon A eyedrops.   I recommend resting today. However, tomorrow I would begin walking and moving around as much as tolerated. Begin stretching in a couple of days. The worse thing you can do for low back pain is lie in bed all day or sit down all day. Use medications as needed.   Just to know, flexeril can cause side effects that may impair your thinking or reactions. Be careful if you drive or do anything that requires you to be awake and alert. void drinking alcohol, which can increase some of the side effects of Flexeril.  NSAIDs like naproxen have common side effects of heartburn, stomach pain, indigestion, and headache. Could lead to renal insufficiency, stroke, or GI bleed if taken excess amounts outside of what is recommended on label long term.    You should avoid heavy lifting or strenuous repetitive activity to prevent recurrence of event. Experiment with both ice and heat and choose whichever feels best for you.  Use heat pad or ice pack, do not apply directly to skin, use barrier such as towel over the skin. Leave on for 15-20 minutes, 3-4 times a day.  Please perform exercises below. Stretches are to be performed for 2 sets, holding 10-15 seconds each. Recommended to perform this rehab twice daily within pain tolerance for 2 weeks.   FLEXION RANGE OF MOTION AND STRETCHING EXERCISES: STRETCH - Flexion, Single Knee to Chest   Lie on a firm bed or floor with both legs extended in front of you.  Keeping one leg in contact with the floor, bring your opposite knee to your chest. Hold your leg in place by either grabbing behind your thigh or at your knee.  Pull until you feel a gentle stretch in your lower back.   Slowly release your grasp and repeat the exercise with the opposite side.  STRETCH - Flexion, Double Knee to Chest   Lie on a firm bed or  floor with both legs extended in front of you.  Keeping one leg in contact with the floor, bring your opposite knee to your chest.  Tense your stomach muscles to support your back and then lift your other knee to your chest. Hold your legs in place by either grabbing behind your thighs or at your knees.  Pull both knees toward your chest until you feel a gentle stretch in your lower back.   Tense your stomach muscles and slowly return one leg at a time to the floor.  STRETCH - Low Trunk Rotation  Lie on a firm bed or floor. Keeping your legs in front of you, bend your knees so they are both pointed toward the ceiling and your feet are flat on the floor.  Extend your arms out to the side. This will stabilize your upper body by keeping your shoulders in contact with the floor.  Gently and slowly drop both knees together to one side until you feel a gentle stretch in your lower back.   Tense your stomach muscles to support your lower back as you bring your knees back to the starting position. Repeat the exercise to the other side.   EXTENSION RANGE OF MOTION AND FLEXIBILITY EXERCISES: STRETCH - Extension, Prone on Elbows   Lie on your stomach on the floor, a bed will be too soft. Place your palms about shoulder width apart and at the height of your head.  Place your elbows under your shoulders. If this is too painful, stack pillows under your chest.  Allow your body to relax so that your hips drop lower and make contact more completely with the floor.  Slowly return to lying flat on the floor.  RANGE OF MOTION - Extension, Prone Press Ups  Lie on your stomach on the floor, a bed will be too soft. Place your palms about shoulder width apart and at the height of your head.  Keeping your back as relaxed as possible, slowly straighten your elbows while keeping your hips on the floor. You may adjust the placement of your hands to maximize your comfort. As you gain motion, your hands will  come more underneath your shoulders.  Slowly return to lying flat on the floor.  RANGE OF MOTION- Quadruped, Neutral Spine   Assume a hands and knees position on a firm surface. Keep your hands under your shoulders and your knees under your hips. You may place padding under your knees for comfort.  Drop your head and point your tail bone toward the ground below you. This will round out your lower back like an angry cat.    Slowly lift your head and release your tail bone so that your back sags into a large arch, like an old horse.  Repeat this until you feel limber in your lower back.  Now, find your "sweet spot." This will be the most comfortable position somewhere between the two previous positions. This is your neutral spine. Once you have found this position, tense your stomach muscles to support your lower back.  STRENGTHENING EXERCISES - Low Back Strain These exercises may help you when beginning to rehabilitate your injury. These exercises should be done near your "sweet spot." This is the neutral, low-back arch, somewhere between fully rounded and fully arched, that is your least painful position. When performed in this safe range of motion, these exercises can be used for people who have either a flexion or extension based injury. These exercises may resolve your symptoms with or without further involvement from your physician, physical therapist or athletic trainer. While completing these exercises, remember:   Muscles can gain both the endurance and the strength needed for everyday activities through controlled exercises.  Complete these exercises as instructed by your physician, physical therapist or athletic trainer. Increase the resistance and repetitions only as guided.  You may experience muscle soreness or fatigue, but the pain or discomfort you are trying to eliminate should never worsen during these exercises. If this pain does worsen, stop and make certain you are following  the directions exactly. If the pain is still present after adjustments, discontinue the exercise until you can discuss the trouble with your caregiver.  STRENGTHENING - Deep Abdominals, Pelvic Tilt  Lie on a firm bed or floor. Keeping your legs in front of you, bend your knees so they are both pointed toward the ceiling and your feet are flat on the floor.  Tense your lower abdominal muscles to press your lower back into the floor. This motion will rotate your pelvis so that your tail bone is scooping upwards rather than pointing at your feet or into the floor.  STRENGTHENING - Abdominals, Crunches   Lie on a firm bed or floor. Keeping your legs in front of you, bend your knees so they are both pointed toward the ceiling and your feet are flat on the floor. Cross your arms over your chest.  Slightly tip your chin down  without bending your neck.  Tense your abdominals and slowly lift your trunk high enough to just clear your shoulder blades. Lifting higher can put excessive stress on the lower back and does not further strengthen your abdominal muscles.  Control your return to the starting position.  STRENGTHENING - Quadruped, Opposite UE/LE Lift   Assume a hands and knees position on a firm surface. Keep your hands under your shoulders and your knees under your hips. You may place padding under your knees for comfort.  Find your neutral spine and gently tense your abdominal muscles so that you can maintain this position. Your shoulders and hips should form a rectangle that is parallel with the floor and is not twisted.  Keeping your trunk steady, lift your right hand no higher than your shoulder and then your left leg no higher than your hip. Make sure you are not holding your breath.   Continuing to keep your abdominal muscles tense and your back steady, slowly return to your starting position. Repeat with the opposite arm and leg.  STRENGTHENING - Lower Abdominals, Double Knee  Lift  Lie on a firm bed or floor. Keeping your legs in front of you, bend your knees so they are both pointed toward the ceiling and your feet are flat on the floor.  Tense your abdominal muscles to brace your lower back and slowly lift both of your knees until they come over your hips. Be certain not to hold your breath.  POSTURE AND BODY MECHANICS CONSIDERATIONS - Low Back Strain Keeping correct posture when sitting, standing or completing your activities will reduce the stress put on different body tissues, allowing injured tissues a chance to heal and limiting painful experiences. The following are general guidelines for improved posture. Your physician or physical therapist will provide you with any instructions specific to your needs. While reading these guidelines, remember:  The exercises prescribed by your provider will help you have the flexibility and strength to maintain correct postures.  The correct posture provides the best environment for your joints to work. All of your joints have less wear and tear when properly supported by a spine with good posture. This means you will experience a healthier, less painful body.  Correct posture must be practiced with all of your activities, especially prolonged sitting and standing. Correct posture is as important when doing repetitive low-stress activities (typing) as it is when doing a single heavy-load activity (lifting). RESTING POSITIONS Consider which positions are most painful for you when choosing a resting position. If you have pain with flexion-based activities (sitting, bending, stooping, squatting), choose a position that allows you to rest in a less flexed posture. You would want to avoid curling into a fetal position on your side. If your pain worsens with extension-based activities (prolonged standing, working overhead), avoid resting in an extended position such as sleeping on your stomach. Most people will find more comfort when  they rest with their spine in a more neutral position, neither too rounded nor too arched. Lying on a non-sagging bed on your side with a pillow between your knees, or on your back with a pillow under your knees will often provide some relief. Keep in mind, being in any one position for a prolonged period of time, no matter how correct your posture, can still lead to stiffness. PROPER SITTING POSTURE In order to minimize stress and discomfort on your spine, you must sit with correct posture. Sitting with good posture should be effortless for a  healthy body. Returning to good posture is a gradual process. Many people can work toward this most comfortably by using various supports until they have the flexibility and strength to maintain this posture on their own. When sitting with proper posture, your ears will fall over your shoulders and your shoulders will fall over your hips. You should use the back of the chair to support your upper back. Your lower back will be in a neutral position, just slightly arched. You may place a small pillow or folded towel at the base of your lower back for support.  When working at a desk, create an environment that supports good, upright posture. Without extra support, muscles tire, which leads to excessive strain on joints and other tissues. Keep these recommendations in mind: CHAIR:  A chair should be able to slide under your desk when your back makes contact with the back of the chair. This allows you to work closely.  The chair's height should allow your eyes to be level with the upper part of your monitor and your hands to be slightly lower than your elbows. BODY POSITION  Your feet should make contact with the floor. If this is not possible, use a foot rest.  Keep your ears over your shoulders. This will reduce stress on your neck and lower back. INCORRECT SITTING POSTURES  If you are feeling tired and unable to assume a healthy sitting posture, do not slouch or  slump. This puts excessive strain on your back tissues, causing more damage and pain. Healthier options include:  Using more support, like a lumbar pillow.  Switching tasks to something that requires you to be upright or walking.  Talking a brief walk.  Lying down to rest in a neutral-spine position. PROLONGED STANDING WHILE SLIGHTLY LEANING FORWARD  When completing a task that requires you to lean forward while standing in one place for a long time, place either foot up on a stationary 2-4 inch high object to help maintain the best posture. When both feet are on the ground, the lower back tends to lose its slight inward curve. If this curve flattens (or becomes too large), then the back and your other joints will experience too much stress, tire more quickly, and can cause pain. CORRECT STANDING POSTURES Proper standing posture should be assumed with all daily activities, even if they only take a few moments, like when brushing your teeth. As in sitting, your ears should fall over your shoulders and your shoulders should fall over your hips. You should keep a slight tension in your abdominal muscles to brace your spine. Your tailbone should point down to the ground, not behind your body, resulting in an over-extended swayback posture.  INCORRECT STANDING POSTURES  Common incorrect standing postures include a forward head, locked knees and/or an excessive swayback. WALKING Walk with an upright posture. Your ears, shoulders and hips should all line-up. PROLONGED ACTIVITY IN A FLEXED POSITION When completing a task that requires you to bend forward at your waist or lean over a low surface, try to find a way to stabilize 3 out of 4 of your limbs. You can place a hand or elbow on your thigh or rest a knee on the surface you are reaching across. This will provide you more stability so that your muscles do not fatigue as quickly. By keeping your knees relaxed, or slightly bent, you will also reduce  stress across your lower back. CORRECT LIFTING TECHNIQUES DO :   Assume a wide  stance. This will provide you more stability and the opportunity to get as close as possible to the object which you are lifting.  Tense your abdominals to brace your spine. Bend at the knees and hips. Keeping your back locked in a neutral-spine position, lift using your leg muscles. Lift with your legs, keeping your back straight.  Test the weight of unknown objects before attempting to lift them.  Try to keep your elbows locked down at your sides in order get the best strength from your shoulders when carrying an object.  Always ask for help when lifting heavy or awkward objects. INCORRECT LIFTING TECHNIQUES DO NOT:   Lock your knees when lifting, even if it is a small object.  Bend and twist. Pivot at your feet or move your feet when needing to change directions.  Assume that you can safely pick up even a paper clip without proper posture.      IF you received an x-ray today, you will receive an invoice from [facility]. Please contact [facility] at [phone] with questions or concerns regarding your invoice.   IF you received labwork today, you will receive an invoice from [facility]. Please contact [facility] at [phone] with questions or concerns regarding your invoice.   Our billing staff will not be able to assist you with questions regarding bills from these companies.  You will be contacted with the lab results as soon as they are available. The fastest way to get your results is to activate your My Chart account. Instructions are located on the last page of this paperwork. If you have not heard from [place] regarding the results in 2 weeks, please contact this office.

## 2018-07-18 NOTE — Progress Notes (Signed)
Sharon [name]  MRN: [id number] DOB: 02-Feb-1966  Subjective:  Sharon [name] is a 52 y.o. female seen in office today for a chief complaint of low back pain. The patient has had recurrent self limited episodes of low back pain in the past. Symptoms have been present for 1 week and are unchanged.  Onset was related to / precipitated by no known injury but she does lots of heavy lifting at work. Uses her right side mostly.  The pain is located in the right lumbar area or and right side and does not radiate. The pain is described as sharp and occurs intermittently.  Symptoms are exacerbated by lifting and twisting. Symptoms are improved by rest and compression band . She denies weakness in the right leg, weakness in the left leg, tingling in the right leg, tingling in the left leg, burning pain in the right leg, burning pain in the left leg, urinary hesitancy, urinary incontinence, urinary retention, bowel incontinence, constipation, impotence and groin/perineal numbness associated with the back pain.   Review of Systems  Constitutional: Negative for chills and fever.  HENT: Positive for congestion and sneezing. Negative for sinus pressure and sinus pain.   Eyes: Positive for discharge (clear watery eyes) and itching.  Genitourinary: Negative for hematuria.  Allergic/Immunologic: Positive for environmental allergies (seasonal allergies, very bad this time of year).    Patient Active Problem List   Diagnosis Date Noted  . Acute pain of right shoulder 02/21/2018  . Right shoulder tendinitis 02/21/2018  . Musculoskeletal pain 02/21/2018  . Muscle spasm 02/21/2018  . HSV (herpes simplex virus) infection 01/26/2018  . Diverticulosis 06/21/2014  . Fibroid, uterine 06/21/2014  . GERD 08/06/2009  . MORBID OBESITY 05/26/2008  . Essential hypertension 02/19/2007    Current Outpatient Medications on File Prior to Visit  Medication Sig Dispense Refill  . amLODipine (NORVASC) 10 MG tablet TAKE 1  TABLET BY MOUTH EVERY DAY 30 tablet 0  . cetirizine (ZYRTEC) 10 MG tablet Take 1 tablet (10 mg total) by mouth daily. 30 tablet 11  . fluticasone (FLONASE) 50 MCG/ACT nasal spray Place 2 sprays into both nostrils daily. 9.9 g 5  . lisinopril-hydrochlorothiazide (PRINZIDE,ZESTORETIC) 20-25 MG tablet TAKE 1 TABLET BY MOUTH EVERY DAY 90 tablet 0  . montelukast (SINGULAIR) 10 MG tablet Take 1 tablet (10 mg total) by mouth at bedtime. 90 tablet 3  . triamcinolone cream (KENALOG) 0.5 % Apply 1 application topically 2 (two) times daily. 30 g 0  . valACYclovir (VALTREX) 500 MG tablet TAKE 1 TABLET BY MOUTH 2 TIMES DIALY FOR 3 DAYS AS NEEDED OUTBREAK 30 tablet 0  . loratadine (CLARITIN) 10 MG tablet Take 1 tablet (10 mg total) by mouth daily. 90 tablet 3  . olopatadine (PATANOL) 0.1 % ophthalmic solution Place 1 drop into both eyes 2 (two) times daily. (Patient not taking: Reported on 07/18/2018) 5 mL 5  . ranitidine (ZANTAC) 150 MG tablet Take 1 tablet (150 mg total) by mouth 2 (two) times daily. (Patient not taking: Reported on 07/18/2018) 30 tablet 0   No current facility-administered medications on file prior to visit.     Allergies  Allergen Reactions  . Codeine Nausea And Vomiting and Other (See Comments)    Light headedness     Objective:  BP 108/72 (BP Location: Left Arm, Patient Position: Sitting, Cuff Size: Large)   Pulse 72   Temp 98.8 F (37.1 C) (Oral)   Resp 16   Ht 5\' 4"  (1.626 m)  Wt 223 lb 6.4 oz (101.3 kg)   LMP 03/01/2015   SpO2 99%   BMI 38.35 kg/m   Physical Exam  Constitutional: She is oriented to person, place, and time. She appears well-developed and well-nourished. No distress.  HENT:  Head: Normocephalic and atraumatic.  Nose: Mucosal edema present.  Eyes: Conjunctivae are normal. Right eye exhibits discharge (watery discharge). Left eye exhibits discharge (watery discharge).  Neck: Normal range of motion.  Pulmonary/Chest: Effort normal.  Abdominal: Soft.  Normal appearance and bowel sounds are normal. There is no tenderness. There is no rigidity, no guarding, no tenderness at McBurney's point and negative Murphy's sign.  Musculoskeletal:       Thoracic back: Normal.       Lumbar back: She exhibits tenderness (reproducible TTP with palpation of right sided lumbar musculature, with lateral flexion and trunk rotation to the right). She exhibits normal range of motion, no bony tenderness and no spasm.  Neurological: She is alert and oriented to person, place, and time. Gait normal.  Reflex Scores:      Patellar reflexes are 2+ on the right side and 2+ on the left side.      Achilles reflexes are 2+ on the right side and 2+ on the left side. Sensation of BLE intact Strength of BLE 5/5 Negative SLR bilaterally  Skin: Skin is warm and dry.  Psychiatric: She has a normal mood and affect.  Vitals reviewed.    Results for orders placed or performed in visit on 07/18/18 (from the past 24 hour(s))  POCT urinalysis dipstick     Status: Abnormal   Collection Time: 07/18/18  6:11 PM  Result Value Ref Range   Color, UA yellow yellow   Clarity, UA clear clear   Glucose, UA negative negative mg/dL   Bilirubin, UA negative negative   Ketones, POC UA trace (5) (A) negative mg/dL   Spec Grav, UA >=1.030 (A) 1.010 - 1.025   Blood, UA trace-lysed (A) negative   pH, UA 5.5 5.0 - 8.0   Protein Ur, POC negative negative mg/dL   Urobilinogen, UA 0.2 0.2 or 1.0 E.U./dL   Nitrite, UA Negative Negative   Leukocytes, UA Negative Negative  POCT Microscopic Urinalysis ([facility])     Status: Abnormal   Collection Time: 07/18/18  6:14 PM  Result Value Ref Range   WBC,UR,HPF,POC Few (A) None WBC/hpf   RBC,UR,HPF,POC None None RBC/hpf   Bacteria None None, Too numerous to count   Mucus Absent Absent   Epithelial Cells, UR Per Microscopy Many (A) None, Too numerous to count cells/hpf    Assessment and Plan :  1. Acute right-sided low back pain without sciatica Hx  and PE findings consistent with low back strain. No acute findings on neuro exam. No midline tenderness. Urine micro negative for RBCs. There are few WBCs but it was a dirty sample as patient could not provide two samples. Denies dysuria, urinary frequency, and urgency. Recommend conservative tx with rest, heating pad, NSAIDs, muscle relaxants, and stretching as tolerated. Given education material for low back stretches. Avoid heavy lifting until pain improves. Advised to return to clinic if symptoms worsen, do not improve, or as needed.  - POCT urinalysis dipstick - POCT Microscopic Urinalysis ([facility]) - naproxen (NAPROSYN) 500 MG tablet; Take 1 tablet (500 mg total) by mouth 2 (two) times daily with a meal.  Dispense: 30 tablet; Refill: 0 - cyclobenzaprine (FLEXERIL) 5 MG tablet; Take 1 tablet (5 mg total) by mouth 3 (three)  times daily as needed for muscle spasms.  Dispense: 60 tablet; Refill: 0  2. Seasonal allergies Rec cont daily zyrtec. Start flonase daily. Use OTC opcon A eye drops for itchy watery eyes.    Tenna Delaine PA-C  [facility] 07/18/2018 6:40 PM

## 2018-07-19 ENCOUNTER — Telehealth: Payer: Self-pay | Admitting: Physician Assistant

## 2018-07-19 NOTE — Telephone Encounter (Signed)
Copied from [id number] [phone]. Topic: Quick Communication - See Telephone Encounter >> Jul 19, 2018  8:40 AM Gardiner Ramus wrote: CRM for notification. See Telephone encounter for: 07/19/18. Pt called and stated that the doctors note from 07/18/18 did not have enough information and her work will not accept it. Pt states that it has to state that she can not come in. Please advise. She would like a call back

## 2018-07-25 ENCOUNTER — Other Ambulatory Visit: Payer: Self-pay | Admitting: *Deleted

## 2018-07-25 ENCOUNTER — Other Ambulatory Visit: Payer: Self-pay | Admitting: Physician Assistant

## 2018-07-25 DIAGNOSIS — I10 Essential (primary) hypertension: Secondary | ICD-10-CM

## 2018-07-25 NOTE — Telephone Encounter (Signed)
Amlodipine 10 mg  Refill. previous prescription discontinued that was written by S. Weber.  Last Refill:07/02/18 # 30 Last OV: 07/18/18  With B.Timmothy Euler PCP: former pt of S. Weber Pharmacy:[facility] [phone] Last annual with S. Weber was 01/26/18.

## 2018-07-27 ENCOUNTER — Other Ambulatory Visit: Payer: Self-pay | Admitting: Physician Assistant

## 2018-07-27 DIAGNOSIS — B009 Herpesviral infection, unspecified: Secondary | ICD-10-CM

## 2018-07-29 ENCOUNTER — Telehealth: Payer: Self-pay | Admitting: Physician Assistant

## 2018-07-29 NOTE — Telephone Encounter (Signed)
Rx sent to pharmacy today.

## 2018-07-29 NOTE — Telephone Encounter (Signed)
Called patient regarding her refill for valacyclovir. She wants to continue seeing B. Timmothy Euler as her pcp and would like get a referral for a colonoscopy. Please advised pt regarding this.

## 2018-08-14 ENCOUNTER — Other Ambulatory Visit: Payer: Self-pay | Admitting: Physician Assistant

## 2018-08-14 DIAGNOSIS — M545 Low back pain, unspecified: Secondary | ICD-10-CM

## 2018-08-14 NOTE — Telephone Encounter (Signed)
Requested medication (s) are due for refill today: yes  Requested medication (s) are on the active medication list: yes  Last refill:  07/18/18  Future visit scheduled: yes  Notes to clinic:  Med can not be delegated.  Requested Prescriptions  Pending Prescriptions Disp Refills   cyclobenzaprine (FLEXERIL) 5 MG tablet [Pharmacy Med Name: CYCLOBENZAPRINE 5 MG TABLET] 60 tablet 0    Sig: TAKE 1 TABLET BY MOUTH THREE TIMES A DAY AS NEEDED FOR MUSCLE SPASMS     Not Delegated - Analgesics:  Muscle Relaxants Failed - 08/14/2018  1:02 PM      Failed - This refill cannot be delegated      Passed - Valid encounter within last 6 months    Recent Outpatient Visits          3 weeks ago Acute right-sided low back pain without sciatica   [facility] [place], [place] D, PA-C   5 months ago Insect bite of left lower leg, initial encounter   [facility] at [place], PA-C   5 months ago Acute pain of right shoulder   [facility] at [facility], Ines Bloomer, MD   6 months ago Annual physical exam   [facility], Damaris Hippo, PA-C   1 year ago Essential hypertension   [facility], Lilia Argue, MD

## 2018-08-24 ENCOUNTER — Other Ambulatory Visit: Payer: Self-pay | Admitting: Physician Assistant

## 2018-08-24 DIAGNOSIS — M545 Low back pain, unspecified: Secondary | ICD-10-CM

## 2018-08-24 DIAGNOSIS — I10 Essential (primary) hypertension: Secondary | ICD-10-CM

## 2018-08-26 ENCOUNTER — Telehealth: Payer: Self-pay | Admitting: Physician Assistant

## 2018-08-26 DIAGNOSIS — Z1211 Encounter for screening for malignant neoplasm of colon: Secondary | ICD-10-CM

## 2018-08-26 MED ORDER — LISINOPRIL-HYDROCHLOROTHIAZIDE 20-25 MG PO TABS: 1.0000 | ORAL_TABLET | Freq: Every day | ORAL | 0 refills | Status: DC

## 2018-08-26 NOTE — Telephone Encounter (Signed)
Please advise 

## 2018-08-26 NOTE — Telephone Encounter (Signed)
Please advise  Copied from [place] [id number]. Topic: Referral - Request for Referral >> Aug 26, 2018 12:48 PM Percell Belt A wrote: Has patient seen PCP for this complaint? Yes  *If NO, is insurance requiring patient see PCP for this issue before PCP can refer them?  Yes  Referral for which specialty: Colonoscopy  Preferred provider/office:  Reason for referral: Pt is due a colonoscopy, per pt back in march her ins would not cover the cologuard.  Pt is need a order put in to have one done for ins.

## 2018-08-26 NOTE — Telephone Encounter (Signed)
Requested medication (s) are due for refill today: Yes  Requested medication (s) are on the active medication list: Yes  Last refill:  04/22/18  Future visit scheduled: No  Notes to clinic:  Please send to Dr. Pamella Pert for approval to refill former [facility] patient.    Requested Prescriptions  Pending Prescriptions Disp Refills   lisinopril-hydrochlorothiazide (PRINZIDE,ZESTORETIC) 20-25 MG tablet 90 tablet 1    Sig: Take 1 tablet by mouth daily.     Cardiovascular:  ACEI + Diuretic Combos Failed - 08/26/2018 12:45 PM      Failed - Na in normal range and within 180 days    Sodium  Date Value Ref Range Status  01/26/2018 141 134 - 144 mmol/L Final         Failed - K in normal range and within 180 days    Potassium  Date Value Ref Range Status  01/26/2018 3.8 3.5 - 5.2 mmol/L Final         Failed - Cr in normal range and within 180 days    Creat  Date Value Ref Range Status  03/04/2015 0.99 0.50 - 1.10 mg/dL Final   Creatinine, Ser  Date Value Ref Range Status  01/26/2018 0.86 0.57 - 1.00 mg/dL Final         Failed - Ca in normal range and within 180 days    Calcium  Date Value Ref Range Status  01/26/2018 9.9 8.7 - 10.2 mg/dL Final         Passed - Patient is not pregnant      Passed - Last BP in normal range    BP Readings from Last 1 Encounters:  07/18/18 108/72         Passed - Valid encounter within last 6 months    Recent Outpatient Visits          1 month ago Acute right-sided low back pain without sciatica   [facility] at [place], [place] D, PA-C   5 months ago Insect bite of left lower leg, initial encounter   [facility], Damaris Hippo, PA-C   6 months ago Acute pain of right shoulder   Primary Care at [facility], Ines Bloomer, MD   7 months ago Annual physical exam   [facility], Damaris Hippo, PA-C   1 year ago Essential hypertension   [facility], Lilia Argue, MD           Refused  Prescriptions Disp Refills   cyclobenzaprine (FLEXERIL) 5 MG tablet [Pharmacy Med Name: CYCLOBENZAPRINE 5 MG TABLET] 60 tablet 0    Sig: TAKE 1 TABLET BY MOUTH THREE TIMES A DAY AS NEEDED FOR MUSCLE SPASMS     Not Delegated - Analgesics:  Muscle Relaxants Failed - 08/26/2018 12:45 PM      Failed - This refill cannot be delegated      Passed - Valid encounter within last 6 months    Recent Outpatient Visits          1 month ago Acute right-sided low back pain without sciatica   [facility] at [place], [place] D, PA-C   5 months ago Insect bite of left lower leg, initial encounter   [facility] at [place], PA-C   6 months ago Acute pain of right shoulder   Primary [facility] at [facility], Ines Bloomer, MD   7 months ago Annual physical exam   [facility], Damaris Hippo, PA-C   1 year ago Essential hypertension   [facility], Lilia Argue, MD            naproxen (NAPROSYN) 500 MG tablet [Pharmacy Med Name: NAPROXEN 500 MG TABLET] 30 tablet 0    Sig: TAKE 1 TABLET (500 MG TOTAL) BY MOUTH 2 (TWO) TIMES DAILY WITH A MEAL.     Analgesics:  NSAIDS Passed - 08/26/2018 12:45 PM      Passed - Cr in normal range and within 360 days    Creat  Date Value Ref Range Status  03/04/2015 0.99 0.50 - 1.10 mg/dL Final   Creatinine, Ser  Date Value Ref Range Status  01/26/2018 0.86 0.57 - 1.00 mg/dL Final         Passed - HGB in normal range and within 360 days    Hemoglobin  Date Value Ref Range Status  01/26/2018 13.2 11.1 - 15.9 g/dL Final         Passed - Patient is not pregnant      Passed - Valid encounter within last 12 months    Recent Outpatient Visits          1 month ago Acute right-sided low back pain without sciatica   [facility] [place], [place] D, PA-C   5 months ago Insect bite of left lower leg, initial encounter   [facility] at [place], PA-C   6 months ago Acute pain of right shoulder    Primary [facility] at [facility], Ines Bloomer, MD   7 months ago Annual physical exam   [facility], Damaris Hippo, PA-C   1 year ago Essential hypertension   [facility], Lilia Argue, MD

## 2018-08-26 NOTE — Telephone Encounter (Signed)
Patient called and advised she will need to schedule an appointment to follow up on the back pain, if the requested medications are needed, Ibuprofen and Flexeril. She says "I was just seen in the office not that long ago. Why do I need to come back in?" I advised that she was seen last on 07/18/18 and it was for an acute issue of back pain, the medication was prescribed x 1 month and it was noted to return if symptoms worsen or did not improve. She says "I need the flexeril for my arm and she knows about it." I advised the Flexeril was originally ordered for her back, so she will need to come in the office to see someone, if it is needed, she says "well, it is hard for me to get off work for appointments. I've already had my physical with Windell Hummingbird, so I don't need to be seen until I need something done. Where did Judson Roch go, [place]? Where is [facility]?" I called the office and was told [facility] on [address] is where she went, I advised the patient of the information. She says "I will keep coming to see [place] for now until I decide to switch, since I'm ok on a physical." I advised if she decides to stay at [facility], to schedule a future transfer of care visit with Tenna Delaine, she verbalized understanding. She also says she needs a refill on her blood pressure medication and says "I shouldn't have to come in every month for refill on my BP medication." I advised not for BP refills, but she will need to be seen every 6 months to 1 year, she verbalized understanding.

## 2018-08-27 NOTE — Telephone Encounter (Signed)
Referral placed.

## 2018-08-28 ENCOUNTER — Other Ambulatory Visit: Payer: Self-pay | Admitting: Physician Assistant

## 2018-08-28 ENCOUNTER — Encounter: Payer: Self-pay | Admitting: Gastroenterology

## 2018-08-28 DIAGNOSIS — R921 Mammographic calcification found on diagnostic imaging of breast: Secondary | ICD-10-CM

## 2018-08-30 ENCOUNTER — Ambulatory Visit
Admission: RE | Admit: 2018-08-30 | Discharge: 2018-08-30 | Disposition: A | Discharge status: Home / Self Care | Payer: Federal, State, Local not specified - PPO | Source: Ambulatory Visit | Attending: Physician Assistant | Admitting: Physician Assistant

## 2018-08-30 DIAGNOSIS — R921 Mammographic calcification found on diagnostic imaging of breast: Secondary | ICD-10-CM | POA: Diagnosis not present

## 2018-09-14 DIAGNOSIS — J0140 Acute pansinusitis, unspecified: Secondary | ICD-10-CM | POA: Diagnosis not present

## 2018-10-11 ENCOUNTER — Encounter: Payer: Federal, State, Local not specified - PPO | Admitting: Gastroenterology

## 2018-11-08 ENCOUNTER — Telehealth: Payer: Self-pay

## 2018-11-08 NOTE — Telephone Encounter (Signed)
Pt was a no show today for 4:30 previsit appointment. Pt verbalize she forgot. Pt verbalize she will call us back to reschedule previsit and colonoscopy procedure.

## 2018-11-22 ENCOUNTER — Encounter: Payer: Federal, State, Local not specified - PPO | Admitting: Gastroenterology

## 2019-01-03 DIAGNOSIS — Z79899 Other long term (current) drug therapy: Secondary | ICD-10-CM | POA: Diagnosis not present

## 2019-01-03 DIAGNOSIS — E668 Other obesity: Secondary | ICD-10-CM | POA: Diagnosis not present

## 2019-01-03 DIAGNOSIS — R0602 Shortness of breath: Secondary | ICD-10-CM | POA: Diagnosis not present

## 2019-01-03 DIAGNOSIS — R5383 Other fatigue: Secondary | ICD-10-CM | POA: Diagnosis not present

## 2019-01-07 DIAGNOSIS — R9431 Abnormal electrocardiogram [ECG] [EKG]: Secondary | ICD-10-CM | POA: Diagnosis not present

## 2019-01-10 DIAGNOSIS — R9431 Abnormal electrocardiogram [ECG] [EKG]: Secondary | ICD-10-CM | POA: Diagnosis not present

## 2019-01-10 DIAGNOSIS — I10 Essential (primary) hypertension: Secondary | ICD-10-CM | POA: Diagnosis not present

## 2019-02-03 ENCOUNTER — Other Ambulatory Visit: Payer: Self-pay | Admitting: Family Medicine

## 2019-02-03 ENCOUNTER — Other Ambulatory Visit: Payer: Self-pay

## 2019-02-03 ENCOUNTER — Ambulatory Visit (HOSPITAL_COMMUNITY)
Admission: EM | Admit: 2019-02-03 | Discharge: 2019-02-03 | Disposition: A | Discharge status: Home / Self Care | Payer: Federal, State, Local not specified - PPO | Attending: Family Medicine | Admitting: Family Medicine

## 2019-02-03 ENCOUNTER — Encounter (HOSPITAL_COMMUNITY): Payer: Self-pay | Admitting: Emergency Medicine

## 2019-02-03 DIAGNOSIS — R220 Localized swelling, mass and lump, head: Secondary | ICD-10-CM

## 2019-02-03 DIAGNOSIS — J069 Acute upper respiratory infection, unspecified: Secondary | ICD-10-CM | POA: Diagnosis not present

## 2019-02-03 DIAGNOSIS — J3489 Other specified disorders of nose and nasal sinuses: Secondary | ICD-10-CM

## 2019-02-03 DIAGNOSIS — I10 Essential (primary) hypertension: Secondary | ICD-10-CM

## 2019-02-03 DIAGNOSIS — J302 Other seasonal allergic rhinitis: Secondary | ICD-10-CM

## 2019-02-03 MED ORDER — PREDNISONE 5 MG PO TABS: ORAL_TABLET | ORAL | 0 refills | Status: DC

## 2019-02-03 MED ORDER — FLUTICASONE PROPIONATE 50 MCG/ACT NA SUSP: 1.0000 | Freq: Every day | NASAL | 2 refills | Status: DC

## 2019-02-03 NOTE — ED Provider Notes (Signed)
[facility]    CSN: [id number] Arrival date & time: 02/03/19  [place]     History   Chief Complaint Chief Complaint  Patient presents with  . URI  . Diarrhea    HPI Sharon [name] is a 53 y.o. female.   She is presenting with sinus pressure, itchy watery eyes, and diarrhea.  She has had loose stools over the past 3 days.  Her itchy watery eyes and sinus pressure started last Thursday.  She has not taken anything for this.  She is a Development worker, community carrier.  Denies any cough shortness of breath or fever.  Feels like her symptoms are improving.  She usually has allergies this time a year.  Denies any travel.  Denies any exposure anyone similar symptoms.  HPI  Past Medical History:  Diagnosis Date  . Allergy   . Hypertension   . Seasonal allergies     Patient Active Problem List   Diagnosis Date Noted  . Acute pain of right shoulder 02/21/2018  . Right shoulder tendinitis 02/21/2018  . Musculoskeletal pain 02/21/2018  . Muscle spasm 02/21/2018  . HSV (herpes simplex virus) infection 01/26/2018  . Diverticulosis 06/21/2014  . Fibroid, uterine 06/21/2014  . GERD 08/06/2009  . MORBID OBESITY 05/26/2008  . Essential hypertension 02/19/2007    Past Surgical History:  Procedure Laterality Date  . BREAST EXCISIONAL BIOPSY Bilateral 1978  . BREAST SURGERY      OB History   No obstetric history on file.      Home Medications    Prior to Admission medications   Medication Sig Start Date End Date Taking? Authorizing Provider  cetirizine (ZYRTEC) 10 MG tablet Take 1 tablet (10 mg total) by mouth daily. 02/19/17   Ivar Drape D, PA  cyclobenzaprine (FLEXERIL) 5 MG tablet Take 1 tablet (5 mg total) by mouth 3 (three) times daily as needed for muscle spasms. 07/18/18   Tenna Delaine D, PA-C  fluticasone (FLONASE) 50 MCG/ACT nasal spray Place 1 spray into both nostrils daily. 02/03/19   Rosemarie Ax, MD  lisinopril-hydrochlorothiazide (PRINZIDE,ZESTORETIC) 20-25  MG tablet Take 1 tablet by mouth daily. 08/26/18   Rutherford Guys, MD  loratadine (CLARITIN) 10 MG tablet Take 1 tablet (10 mg total) by mouth daily. 10/27/16 10/27/17  Jaynee Eagles, PA-C  montelukast (SINGULAIR) 10 MG tablet Take 1 tablet (10 mg total) by mouth at bedtime. 01/26/18   Gale Journey, Damaris Hippo, PA-C  naproxen (NAPROSYN) 500 MG tablet Take 1 tablet (500 mg total) by mouth 2 (two) times daily with a meal. 07/18/18   Timmothy Euler, [place] D, PA-C  olopatadine (PATANOL) 0.1 % ophthalmic solution Place 1 drop into both eyes 2 (two) times daily. Patient not taking: Reported on 07/18/2018 02/19/17   Ivar Drape D, PA  predniSONE (DELTASONE) 5 MG tablet Take 6 pills for first day, 5 pills second day, 4 pills third day, 3 pills fourth day, 2 pills the fifth day, and 1 pill sixth day. 02/03/19   Rosemarie Ax, MD  ranitidine (ZANTAC) 150 MG tablet Take 1 tablet (150 mg total) by mouth 2 (two) times daily. Patient not taking: Reported on 07/18/2018 03/05/18   Mancel Bale, PA-C  triamcinolone cream (KENALOG) 0.5 % Apply 1 application topically 2 (two) times daily. 03/05/18   [facility], Damaris Hippo, PA-C  valACYclovir (VALTREX) 500 MG tablet TAKE 1 TABLET BY MOUTH 2 TIMES DIALY FOR 3 DAYS AS NEEDED OUTBREAK 07/29/18   Forrest Moron, MD  Family History Family History  Problem Relation Age of Onset  . Diabetes Mother   . Hypertension Father   . Hypertension Sister     Social History Social History   Tobacco Use  . Smoking status: Never Smoker  . Smokeless tobacco: Never Used  Substance Use Topics  . Alcohol use: No    Alcohol/week: 0.0 standard drinks  . Drug use: No     Allergies   Codeine   Review of Systems Review of Systems  Constitutional: Negative for fever.  HENT: Positive for sinus pressure.   Eyes: Positive for itching.  Respiratory: Negative for cough.   Cardiovascular: Negative for chest pain.  Gastrointestinal: Negative for abdominal pain.  Musculoskeletal: Negative  for back pain.  Skin: Negative for color change.  Psychiatric/Behavioral: Negative for agitation.     Physical Exam Triage Vital Signs ED Triage Vitals [02/03/19 1809]  Enc Vitals Group     BP (!) 149/94     Pulse Rate 71     Resp 18     Temp 98.3 F (36.8 C)     Temp Source Oral     SpO2 100 %     Weight      Height      Head Circumference      Peak Flow      Pain Score 5     Pain Loc      Pain Edu?      Excl. in [place]?    No data found.  Updated Vital Signs BP (!) 149/94 (BP Location: Right Arm)   Pulse 71   Temp 98.3 F (36.8 C) (Oral)   Resp 18   LMP 03/01/2015   SpO2 100%   Visual Acuity Right Eye Distance:   Left Eye Distance:   Bilateral Distance:    Right Eye Near:   Left Eye Near:    Bilateral Near:     Physical Exam Gen: NAD, alert, cooperative with exam,  ENT: normal lips, normal nasal mucosa, tympanic membranes clear and intact bilaterally, normal oropharynx, no cervical lymphadenopathy Eye: normal EOM, normal conjunctiva and lids CV:  no edema, +2 pedal pulses, regular rate and rhythm, S1-S2   Resp: no accessory muscle use, non-labored, clear to auscultation bilaterally, no crackles or wheezes Skin: no rashes, no areas of induration  Neuro: normal tone, normal sensation to touch Psych:  normal insight, alert and oriented MSK: Normal gait, normal strength    UC Treatments / Results  Labs (all labs ordered are listed, but only abnormal results are displayed) Labs Reviewed - No data to display  EKG None  Radiology No results found.  Procedures Procedures (including critical care time)  Medications Ordered in UC Medications - No data to display  Initial Impression / Assessment and Plan / UC Course  I have reviewed the triage vital signs and the nursing notes.  Pertinent labs & imaging results that were available during my care of the patient were reviewed by me and considered in my medical decision making (see chart for details).      Sharon [name] is a 53 yo F that is presenting with allergic rhinitis and loose stools. Less likely for viral infection at this time. Will provided prednisone and flonase. Advised on claritin use as well. Loose stools seem to be improving. Counseled on supportive care. Provided with work note that she can return to tomorrow. Given indications to follow up.   Final Clinical Impressions(s) / UC Diagnoses   Final diagnoses:  Upper respiratory tract infection, unspecified type     Discharge Instructions     Please take flonase or claritin after you completed the prednisone  Please try to stay well hydrated  Please wash your hands regularly.  Please follow up if your symptoms fail to improve.     ED Prescriptions    Medication Sig Dispense Auth. Provider   predniSONE (DELTASONE) 5 MG tablet Take 6 pills for first day, 5 pills second day, 4 pills third day, 3 pills fourth day, 2 pills the fifth day, and 1 pill sixth day. 21 tablet Rosemarie Ax, MD   fluticasone [facility]) 50 MCG/ACT nasal spray Place 1 spray into both nostrils daily. 9.9 g Rosemarie Ax, MD     Controlled Substance Prescriptions [place] Controlled Substance Registry consulted? Not Applicable   Rosemarie Ax, MD 02/03/19 Lurena Nida

## 2019-02-03 NOTE — Telephone Encounter (Signed)
[name] pt, please advise on refill

## 2019-02-03 NOTE — ED Triage Notes (Signed)
Pt sts URI sx with diarrhea x 3 days; pt sts congestion she believes from allergies

## 2019-02-03 NOTE — Discharge Instructions (Signed)
Please take flonase or claritin after you completed the prednisone  Please try to stay well hydrated  Please wash your hands regularly.  Please follow up if your symptoms fail to improve.

## 2019-02-04 NOTE — Telephone Encounter (Signed)
Patient needs OV for refills. A 30 day supply was ordered until appointment is made.

## 2019-02-07 ENCOUNTER — Encounter: Payer: Self-pay | Admitting: Family Medicine

## 2019-02-07 NOTE — Consult Note (Signed)
Spoke with patient over the phone she needs a note for work specifying why her 3 days were excused.  Pt states the two notes she gave her employer were not acceptable.

## 2019-02-27 DIAGNOSIS — R635 Abnormal weight gain: Secondary | ICD-10-CM | POA: Diagnosis not present

## 2019-02-27 DIAGNOSIS — Z79899 Other long term (current) drug therapy: Secondary | ICD-10-CM | POA: Diagnosis not present

## 2019-03-01 ENCOUNTER — Other Ambulatory Visit: Payer: Self-pay | Admitting: Family Medicine

## 2019-03-01 DIAGNOSIS — I10 Essential (primary) hypertension: Secondary | ICD-10-CM

## 2019-03-14 DIAGNOSIS — M79601 Pain in right arm: Secondary | ICD-10-CM | POA: Diagnosis not present

## 2019-03-14 DIAGNOSIS — Z13220 Encounter for screening for lipoid disorders: Secondary | ICD-10-CM | POA: Diagnosis not present

## 2019-03-14 DIAGNOSIS — I10 Essential (primary) hypertension: Secondary | ICD-10-CM | POA: Diagnosis not present

## 2019-03-14 DIAGNOSIS — Z0000 Encounter for general adult medical examination without abnormal findings: Secondary | ICD-10-CM | POA: Diagnosis not present

## 2019-03-21 ENCOUNTER — Ambulatory Visit: Payer: Federal, State, Local not specified - PPO | Admitting: Registered Nurse

## 2019-03-21 ENCOUNTER — Encounter: Payer: Self-pay | Admitting: Registered Nurse

## 2019-03-21 ENCOUNTER — Other Ambulatory Visit: Payer: Self-pay

## 2019-03-21 VITALS — BP 142/88 | HR 64 | Temp 98.0°F | Resp 16 | Ht 64.0 in | Wt 228.0 lb

## 2019-03-21 DIAGNOSIS — J302 Other seasonal allergic rhinitis: Secondary | ICD-10-CM | POA: Diagnosis not present

## 2019-03-21 DIAGNOSIS — I10 Essential (primary) hypertension: Secondary | ICD-10-CM

## 2019-03-21 DIAGNOSIS — M25571 Pain in right ankle and joints of right foot: Secondary | ICD-10-CM

## 2019-03-21 DIAGNOSIS — M25511 Pain in right shoulder: Secondary | ICD-10-CM

## 2019-03-21 DIAGNOSIS — Z7689 Persons encountering health services in other specified circumstances: Secondary | ICD-10-CM | POA: Diagnosis not present

## 2019-03-21 DIAGNOSIS — B009 Herpesviral infection, unspecified: Secondary | ICD-10-CM

## 2019-03-21 MED ORDER — LISINOPRIL-HYDROCHLOROTHIAZIDE 20-25 MG PO TABS: 1.0000 | ORAL_TABLET | Freq: Every day | ORAL | 0 refills | Status: AC

## 2019-03-21 MED ORDER — VALACYCLOVIR HCL 500 MG PO TABS: ORAL_TABLET | ORAL | 0 refills | Status: AC

## 2019-03-21 MED ORDER — AZELASTINE HCL 0.05 % OP SOLN: 1.0000 [drp] | Freq: Two times a day (BID) | OPHTHALMIC | 12 refills | Status: AC

## 2019-03-21 MED ORDER — MELOXICAM 7.5 MG PO TABS: 7.5000 mg | ORAL_TABLET | Freq: Every day | ORAL | 0 refills | Status: AC

## 2019-03-21 NOTE — Patient Instructions (Signed)
° ° ° °  If you have lab work done today you will be contacted with your lab results within the next 2 weeks.  If you have not heard from us then please contact us. The fastest way to get your results is to register for My Chart. ° ° °IF you received an x-ray today, you will receive an invoice from [facility]. Please contact [facility] at [phone] with questions or concerns regarding your invoice.  ° °IF you received labwork today, you will receive an invoice from [facility]. Please contact [facility] at [phone] with questions or concerns regarding your invoice.  ° °Our billing staff will not be able to assist you with questions regarding bills from these companies. ° °You will be contacted with the lab results as soon as they are available. The fastest way to get your results is to activate your My Chart account. Instructions are located on the last page of this paperwork. If you have not heard from us regarding the results in 2 weeks, please contact this office. °  ° ° ° °

## 2019-03-21 NOTE — Progress Notes (Signed)
Established Patient Office Visit  Subjective:  Patient ID: Sharon [name], female    DOB: 22-Mar-1966  Age: 53 y.o. MRN: [id number]  CC:  Chief Complaint  Patient presents with  . Arm Pain    pt states she has been having right arm pain/swelling x 2 weeks. Her right ankle is also swollen     HPI Sharon [name] presents to establish care with myself as PCP, discuss shoulder pain, ankle pain, medication refills, and weight loss. We were able to touch on each of these to a limited extent as time allowed.  Med refills: Zestoretic, valacyclovir refilled. Will reassess BP in two weeks, hypertensive today, may warrant dosage increase or additional agent  Shoulder pain: increasing in previous weeks. Seen 1 year prior, had films, showed potential tendonitis and possible early OA. Pain is significantly worse, hard to lift arm above head, perceived weakness in extremity, causing pain to radiate towards elbow and neck.  Ankle Pain: repetitive motion through driving for [employer], swelling and internal rotation. Discomfort. Has been given ankle brace in past, does not use it any longer.   Weight loss: patient is interested in losing weight and asked about bariatric surgery - though this is not yet an option for this patient, we discussed diet and exercise to start weight loss with potential referral to weight loss center in the coming months with positive change  Past Medical History:  Diagnosis Date  . Allergy   . Hypertension   . Seasonal allergies     Past Surgical History:  Procedure Laterality Date  . BREAST EXCISIONAL BIOPSY Bilateral 1978  . BREAST SURGERY      Family History  Problem Relation Age of Onset  . Diabetes Mother   . Hypertension Father   . Hypertension Sister   . Breast cancer Maternal Aunt     Social History   Socioeconomic History  . Marital status: Single    Spouse name: Not on file  . Number of children: Not on file  . Years of education: Not on file  .  Highest education level: Not on file  Occupational History  . Occupation: mail carrier    Employer: [employer]  Social Needs  . Financial resource strain: Not hard at all  . Food insecurity:    Worry: Never true    Inability: Never true  . Transportation needs:    Medical: No    Non-medical: No  Tobacco Use  . Smoking status: Never Smoker  . Smokeless tobacco: Never Used  Substance and Sexual Activity  . Alcohol use: No    Alcohol/week: 0.0 standard drinks  . Drug use: No  . Sexual activity: Yes    Birth control/protection: None  Lifestyle  . Physical activity:    Days per week: 0 days    Minutes per session: 0 min  . Stress: Not at all  Relationships  . Social connections:    Talks on phone: More than three times a week    Gets together: Three times a week    Attends religious service: Never    Active member of club or organization: No    Attends meetings of clubs or organizations: Never    Relationship status: Not on file  . Intimate partner violence:    Fear of current or ex partner: No    Emotionally abused: No    Physically abused: No    Forced sexual activity: No  Other Topics Concern  . Not on file  Social History Narrative   Lives alone - son will sometimes stay with her      Mail carrier    Outpatient Medications Prior to Visit  Medication Sig Dispense Refill  . cetirizine (ZYRTEC) 10 MG tablet Take 1 tablet (10 mg total) by mouth daily. 30 tablet 11  . cyclobenzaprine (FLEXERIL) 5 MG tablet Take 1 tablet (5 mg total) by mouth 3 (three) times daily as needed for muscle spasms. 60 tablet 0  . fluticasone (FLONASE) 50 MCG/ACT nasal spray Place 1 spray into both nostrils daily. 9.9 g 2  . montelukast (SINGULAIR) 10 MG tablet Take 1 tablet (10 mg total) by mouth at bedtime. 90 tablet 3  . naproxen (NAPROSYN) 500 MG tablet Take 1 tablet (500 mg total) by mouth 2 (two) times daily with a meal. 30 tablet 0  . olopatadine (PATANOL) 0.1 % ophthalmic solution Place 1  drop into both eyes 2 (two) times daily. 5 mL 5  . phentermine 30 MG capsule Take by mouth daily.    Marland Kitchen triamcinolone cream (KENALOG) 0.5 % Apply 1 application topically 2 (two) times daily. 30 g 0  . lisinopril-hydrochlorothiazide (PRINZIDE,ZESTORETIC) 20-25 MG tablet TAKE 1 TABLET BY MOUTH EVERY DAY 30 tablet 0  . predniSONE (DELTASONE) 5 MG tablet Take 6 pills for first day, 5 pills second day, 4 pills third day, 3 pills fourth day, 2 pills the fifth day, and 1 pill sixth day. 21 tablet 0  . ranitidine (ZANTAC) 150 MG tablet Take 1 tablet (150 mg total) by mouth 2 (two) times daily. 30 tablet 0  . valACYclovir (VALTREX) 500 MG tablet TAKE 1 TABLET BY MOUTH 2 TIMES DIALY FOR 3 DAYS AS NEEDED OUTBREAK 30 tablet 0  . loratadine (CLARITIN) 10 MG tablet Take 1 tablet (10 mg total) by mouth daily. 90 tablet 3  . lisinopril-hydrochlorothiazide (ZESTORETIC) 20-12.5 MG tablet      No facility-administered medications prior to visit.     Allergies  Allergen Reactions  . Codeine Nausea And Vomiting and Other (See Comments)    Light headedness    ROS Review of Systems  Constitutional: Negative.   HENT: Negative.   Eyes: Negative.   Respiratory: Negative.  Negative for shortness of breath.   Cardiovascular: Negative.  Negative for chest pain.  Gastrointestinal: Negative.   Endocrine: Negative.   Genitourinary: Negative.   Musculoskeletal: Positive for arthralgias (per hpi) and neck stiffness.  Skin: Negative.   Allergic/Immunologic: Negative.   Neurological: Positive for weakness (per hpi).  Hematological: Negative.   Psychiatric/Behavioral: Negative.       Objective:    Physical Exam  Constitutional: She is oriented to person, place, and time. She appears well-developed and well-nourished.  HENT:  Head: Normocephalic and atraumatic.  Neck: Normal range of motion. Neck supple.  Cardiovascular: Normal rate, regular rhythm and intact distal pulses. Exam reveals no gallop and no  friction rub.  No murmur heard. Pulmonary/Chest: Effort normal and breath sounds normal. No respiratory distress. She has no wheezes. She has no rales. She exhibits no tenderness.  Musculoskeletal:        General: Tenderness (r shoulder jointline) present. No deformity or edema.     Right shoulder: She exhibits decreased range of motion, tenderness, pain and decreased strength. She exhibits no bony tenderness, no swelling, no effusion, no crepitus, no deformity, no laceration, no spasm and normal pulse.     Left shoulder: Normal.       Arms:     Comments: Joint  line ttp. Positive can emptying Positive drop test Notable weakness on external rotation Supraspinatus pain when abducting/externally rotating Hx of tendonitis in shoulder   Neurological: She is oriented to person, place, and time.  Skin: Skin is warm and dry. No rash noted. No erythema. No pallor.  Psychiatric: She has a normal mood and affect. Her behavior is normal. Judgment and thought content normal.    BP (!) 142/88   Pulse 64   Temp 98 F (36.7 C) (Oral)   Resp 16   Ht 5\' 4"  (1.626 m)   Wt 228 lb (103.4 kg)   LMP 03/01/2015   SpO2 98%   BMI 39.14 kg/m  Wt Readings from Last 3 Encounters:  03/21/19 228 lb (103.4 kg)  07/18/18 223 lb 6.4 oz (101.3 kg)  03/05/18 225 lb 9.6 oz (102.3 kg)     Health Maintenance Due  Topic Date Due  . COLONOSCOPY  03/14/2014    There are no preventive care reminders to display for this patient.  Lab Results  Component Value Date   TSH 1.760 08/10/2017   Lab Results  Component Value Date   WBC 5.1 01/26/2018   HGB 13.2 01/26/2018   HCT 39.5 01/26/2018   MCV 81 01/26/2018   PLT 264 01/26/2018   Lab Results  Component Value Date   NA 141 01/26/2018   K 3.8 01/26/2018   CO2 18 (L) 01/26/2018   GLUCOSE 93 01/26/2018   BUN 13 01/26/2018   CREATININE 0.86 01/26/2018   BILITOT <0.2 01/26/2018   ALKPHOS 71 01/26/2018   AST 19 01/26/2018   ALT 19 01/26/2018   PROT  7.1 01/26/2018   ALBUMIN 4.4 01/26/2018   CALCIUM 9.9 01/26/2018   Lab Results  Component Value Date   CHOL 201 (H) 01/26/2018   Lab Results  Component Value Date   HDL 73 01/26/2018   Lab Results  Component Value Date   LDLCALC 116 (H) 01/26/2018   Lab Results  Component Value Date   TRIG 60 01/26/2018   Lab Results  Component Value Date   CHOLHDL 2.8 01/26/2018   Lab Results  Component Value Date   HGBA1C 6.2 (H) 01/26/2018      Assessment & Plan:   Problem List Items Addressed This Visit      Cardiovascular and Mediastinum   Essential hypertension   Relevant Medications   lisinopril-hydrochlorothiazide (ZESTORETIC) 20-25 MG tablet     Other   HSV (herpes simplex virus) infection   Relevant Medications   valACYclovir (VALTREX) 500 MG tablet    Other Visit Diagnoses    Seasonal allergies    -  Primary   Relevant Medications   azelastine (OPTIVAR) 0.05 % ophthalmic solution   Pain in joint of right shoulder       Relevant Medications   meloxicam (MOBIC) 7.5 MG tablet   Other Relevant Orders   Ambulatory referral to Orthopedic Surgery   Right ankle pain, unspecified chronicity       Relevant Orders   Ambulatory referral to Physical Therapy      Meds ordered this encounter  Medications  . lisinopril-hydrochlorothiazide (ZESTORETIC) 20-25 MG tablet    Sig: Take 1 tablet by mouth daily.    Dispense:  30 tablet    Refill:  0    DX Code Needed  .    Order Specific Question:   Supervising Provider    Answer:   Forrest Moron [id number]  . valACYclovir (VALTREX) 500 MG tablet  Sig: TAKE 1 TABLET BY MOUTH 2 TIMES DIALY FOR 3 DAYS AS NEEDED OUTBREAK    Dispense:  30 tablet    Refill:  0    Order Specific Question:   Supervising Provider    Answer:   Delia Chimes A [id number]  . azelastine (OPTIVAR) 0.05 % ophthalmic solution    Sig: Place 1 drop into both eyes 2 (two) times daily. As needed for allergy symptoms    Dispense:  6 mL    Refill:  12     Order Specific Question:   Supervising Provider    Answer:   Delia Chimes A [id number]  . meloxicam (MOBIC) 7.5 MG tablet    Sig: Take 1 tablet (7.5 mg total) by mouth daily for 15 days.    Dispense:  15 tablet    Refill:  0    Order Specific Question:   Supervising Provider    Answer:   Forrest Moron [id number]    Follow-up: within 6 mos for CPE   PLAN:  Referral to Ortho for shoulder pain - patient likely has injury of rotator cuff based on history and exam. RICE and meloxicam 7.5mg  PO daily for tx until she is seen  Referral to PT for ankle - history and exam suggest ankle drop.   Medications refilled - BP check in 2 weeks, may need to increase HTN regimen if she is still hypertensive  Return in 6 mos for CPE  Patient encouraged to call clinic with any questions, comments, or concerns.  I spent 28 minutes with this patient, over 50% of which was spent educating/counseling.  Thank you for your visit with Primary Care at [facility] today.  Maximiano Coss, NP [facility] at [address] [place], [place] [address] [phone] - [phone]

## 2019-03-24 ENCOUNTER — Telehealth: Payer: Self-pay | Admitting: Orthopaedic Surgery

## 2019-03-24 ENCOUNTER — Other Ambulatory Visit: Payer: Self-pay | Admitting: Registered Nurse

## 2019-03-24 DIAGNOSIS — R921 Mammographic calcification found on diagnostic imaging of breast: Secondary | ICD-10-CM

## 2019-03-24 NOTE — Telephone Encounter (Signed)
Spoke with patient confirmed appointment for tomorrow

## 2019-03-25 ENCOUNTER — Ambulatory Visit (INDEPENDENT_AMBULATORY_CARE_PROVIDER_SITE_OTHER): Payer: Federal, State, Local not specified - PPO | Admitting: Orthopaedic Surgery

## 2019-03-25 ENCOUNTER — Other Ambulatory Visit: Payer: Self-pay

## 2019-03-25 ENCOUNTER — Encounter: Payer: Self-pay | Admitting: Orthopaedic Surgery

## 2019-03-25 DIAGNOSIS — G8929 Other chronic pain: Secondary | ICD-10-CM | POA: Diagnosis not present

## 2019-03-25 DIAGNOSIS — M25511 Pain in right shoulder: Secondary | ICD-10-CM | POA: Diagnosis not present

## 2019-03-25 MED ORDER — METHYLPREDNISOLONE ACETATE 40 MG/ML IJ SUSP
40.0000 mg | INTRAMUSCULAR | Status: AC | PRN
  Administered 2019-03-25: 40 mg via INTRA_ARTICULAR

## 2019-03-25 MED ORDER — LIDOCAINE HCL 1 % IJ SOLN
3.0000 mL | INTRAMUSCULAR | Status: AC | PRN
  Administered 2019-03-25: 3 mL

## 2019-03-25 MED ORDER — BUPIVACAINE HCL 0.5 % IJ SOLN
3.0000 mL | INTRAMUSCULAR | Status: AC | PRN
  Administered 2019-03-25: 16:00:00 3 mL via INTRA_ARTICULAR

## 2019-03-25 NOTE — Progress Notes (Signed)
Office Visit Note   Patient: Sharon [name]           Date of Birth: [birth year]           MRN: [id number] Visit Date: 03/25/2019              Requested by: Maximiano Coss, NP [place], [place] [address] PCP: Maximiano Coss, NP   Assessment & Plan: Visit Diagnoses:  1. Chronic right shoulder pain     Plan: Impression is chronic right shoulder pain suspect rotator cuff syndrome and tendinosis.  Overall function is preserved therefore like to try a subacromial injection with physical therapy first to see if this will help things.  She is agreeable to this plan.  Work restrictions were provided today.  We will recheck her in 6 weeks for improvement.  Likely will obtain MRI if she does not improve.  Follow-Up Instructions: Return in about 6 weeks (around 05/06/2019).   Orders:  No orders of the defined types were placed in this encounter.  No orders of the defined types were placed in this encounter.     Procedures: Large Joint Inj: R subacromial bursa on 03/25/2019 3:35 PM Indications: pain Details: 22 G needle  Arthrogram: No  Medications: 3 mL lidocaine 1 %; 3 mL bupivacaine 0.5 %; 40 mg methylPREDNISolone acetate 40 MG/ML Outcome: tolerated well, no immediate complications Consent was given by the patient. Patient was prepped and draped in the usual sterile fashion.       Clinical Data: No additional findings.   Subjective: Chief Complaint  Patient presents with  . Right Shoulder - Pain    Sharon [name] is a very pleasant 53 year old female who is right-hand dominant who comes in with a year of right shoulder pain that has recently gotten worse.  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  She will endorse limited range of motion at times.  She has been to the urgent care recently and was referred here.  She previously saw her PCP for this and she obtained x-rays of her right shoulder about a year ago.  She has  not undergone any physical therapy or had any injections.  She denies any injuries or radiculopathy.   Review of Systems  Constitutional: Negative.   HENT: Negative.   Eyes: Negative.   Respiratory: Negative.   Cardiovascular: Negative.   Endocrine: Negative.   Musculoskeletal: Negative.   Neurological: Negative.   Hematological: Negative.   Psychiatric/Behavioral: Negative.   All other systems reviewed and are negative.    Objective: Vital Signs: LMP 03/01/2015   Physical Exam Vitals signs and nursing note reviewed.  Constitutional:      Appearance: She is well-developed.  HENT:     Head: Normocephalic and atraumatic.  Neck:     Musculoskeletal: Neck supple.  Pulmonary:     Effort: Pulmonary effort is normal.  Abdominal:     Palpations: Abdomen is soft.  Skin:    General: Skin is warm.     Capillary Refill: Capillary refill takes less than 2 seconds.  Neurological:     Mental Status: She is alert and oriented to person, place, and time.  Psychiatric:        Behavior: Behavior normal.        Thought Content: Thought content normal.        Judgment: Judgment normal.     Ortho Exam Right shoulder exam shows normal range of motion  with pain at the extremes with some guarding.  Negative impingement.  Rotator cuff grossly intact but with slight weakness secondary to pain.  Negative Spurling's. Specialty Comments:  No specialty comments available.  Imaging: No results found.   PMFS History: Patient Active Problem List   Diagnosis Date Noted  . Acute pain of right shoulder 02/21/2018  . Right shoulder tendinitis 02/21/2018  . Musculoskeletal pain 02/21/2018  . Muscle spasm 02/21/2018  . HSV (herpes simplex virus) infection 01/26/2018  . Diverticulosis 06/21/2014  . Fibroid, uterine 06/21/2014  . GERD 08/06/2009  . MORBID OBESITY 05/26/2008  . Essential hypertension 02/19/2007   Past Medical History:  Diagnosis Date  . Allergy   . Hypertension   .  Seasonal allergies     Family History  Problem Relation Age of Onset  . Diabetes Mother   . Hypertension Father   . Hypertension Sister   . Breast cancer Maternal Aunt     Past Surgical History:  Procedure Laterality Date  . BREAST EXCISIONAL BIOPSY Bilateral 1978  . BREAST SURGERY     Social History   Occupational History  . Occupation: mail carrier    Employer: [employer]  Tobacco Use  . Smoking status: Never Smoker  . Smokeless tobacco: Never Used  Substance and Sexual Activity  . Alcohol use: No    Alcohol/week: 0.0 standard drinks  . Drug use: No  . Sexual activity: Yes    Birth control/protection: None

## 2019-03-29 ENCOUNTER — Other Ambulatory Visit: Payer: Self-pay | Admitting: Registered Nurse

## 2019-03-29 DIAGNOSIS — B009 Herpesviral infection, unspecified: Secondary | ICD-10-CM

## 2019-04-04 DIAGNOSIS — M25511 Pain in right shoulder: Secondary | ICD-10-CM | POA: Diagnosis not present

## 2019-04-04 DIAGNOSIS — M799 Soft tissue disorder, unspecified: Secondary | ICD-10-CM | POA: Diagnosis not present

## 2019-04-04 DIAGNOSIS — M6281 Muscle weakness (generalized): Secondary | ICD-10-CM | POA: Diagnosis not present

## 2019-04-04 DIAGNOSIS — M25611 Stiffness of right shoulder, not elsewhere classified: Secondary | ICD-10-CM | POA: Diagnosis not present

## 2019-04-10 ENCOUNTER — Other Ambulatory Visit: Payer: Self-pay | Admitting: Physician Assistant

## 2019-04-10 DIAGNOSIS — R921 Mammographic calcification found on diagnostic imaging of breast: Secondary | ICD-10-CM

## 2019-04-11 ENCOUNTER — Ambulatory Visit
Admission: RE | Admit: 2019-04-11 | Discharge: 2019-04-11 | Disposition: A | Discharge status: Home / Self Care | Payer: Federal, State, Local not specified - PPO | Source: Ambulatory Visit | Attending: Registered Nurse | Admitting: Registered Nurse

## 2019-04-11 ENCOUNTER — Other Ambulatory Visit: Payer: Self-pay

## 2019-04-11 DIAGNOSIS — R921 Mammographic calcification found on diagnostic imaging of breast: Secondary | ICD-10-CM

## 2019-04-15 ENCOUNTER — Telehealth: Payer: Self-pay

## 2019-04-15 ENCOUNTER — Other Ambulatory Visit: Payer: Self-pay | Admitting: Physician Assistant

## 2019-04-15 NOTE — Telephone Encounter (Signed)
Kathlee Nations from [facility] called and is needed plan of care for patient. Did this get faxed back?   [place] : [phone] TIW: [phone]

## 2019-04-15 NOTE — Telephone Encounter (Signed)
Saw [facility] for [facility]

## 2019-04-16 NOTE — Telephone Encounter (Signed)
Signed and faxed back 6/2. Will re fax

## 2019-04-17 ENCOUNTER — Other Ambulatory Visit: Payer: Self-pay | Admitting: Internal Medicine

## 2019-04-18 ENCOUNTER — Ambulatory Visit: Payer: Federal, State, Local not specified - PPO | Attending: Registered Nurse | Admitting: Physical Therapy

## 2019-04-18 ENCOUNTER — Encounter: Payer: Self-pay | Admitting: Physical Therapy

## 2019-04-18 ENCOUNTER — Other Ambulatory Visit: Payer: Self-pay

## 2019-04-18 DIAGNOSIS — Z6838 Body mass index (BMI) 38.0-38.9, adult: Secondary | ICD-10-CM | POA: Diagnosis not present

## 2019-04-18 DIAGNOSIS — M25571 Pain in right ankle and joints of right foot: Secondary | ICD-10-CM

## 2019-04-18 DIAGNOSIS — R7309 Other abnormal glucose: Secondary | ICD-10-CM | POA: Diagnosis not present

## 2019-04-18 DIAGNOSIS — E6609 Other obesity due to excess calories: Secondary | ICD-10-CM | POA: Diagnosis not present

## 2019-04-18 DIAGNOSIS — I10 Essential (primary) hypertension: Secondary | ICD-10-CM | POA: Diagnosis not present

## 2019-04-18 NOTE — Therapy (Signed)
[place], [place], [address] Phone: [phone]   Fax:  [phone]  Physical Therapy Evaluation  Patient Details  Name: Sharon [name] MRN: [id number] Date of Birth: 1966/06/07 Referring Provider (PT): Maximiano Coss, NP   Encounter Date: 04/18/2019  PT End of Session - 04/18/19 1232    Visit Number  1    Number of Visits  8    Date for PT Re-Evaluation  05/30/19    Authorization Type  BC[employer]    PT Start Time  1107    PT Stop Time  1153    PT Time Calculation (min)  46 min    Activity Tolerance  Patient tolerated treatment well    Behavior During Therapy  [facility] for tasks assessed/performed       Past Medical History:  Diagnosis Date  . Allergy   . Hypertension   . Seasonal allergies     Past Surgical History:  Procedure Laterality Date  . BREAST EXCISIONAL BIOPSY Bilateral 1978  . BREAST SURGERY      There were no vitals filed for this visit.   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 Pt. reports history of intermittent ankle pain issues dating back to fall about 6 years ago-she reports had X-rays and had "chipped bone" but uncertain further specifics. 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 No new mechanism of onset otherwise noted. She reports that she intermittently has pain that radiates from right buttock region toward ankle with sitting such as in trunk but otherwise without significant lumbar complaints.    Pertinent History  HTN, fall 6 years ago with foot injury    Limitations  Standing;Walking   driving truck for work   Patient Stated Goals  Decrease pain with driving    Currently in Pain?  Yes    Pain Score  4     Pain  Location  Ankle    Pain Orientation  Right    Pain Descriptors / Indicators  Dull    Pain Type  Acute pain   acute on chronic   Pain Onset  1 to 4 weeks ago    Pain Frequency  Intermittent    Aggravating Factors   ankle ROM while driving>walking and standing    Pain Relieving Factors  rest    Effect of Pain on Daily Activities  limits tolerance for work duties with driving and stepping out of truck         [facility] PT Assessment - 04/18/19 0001      Assessment   Medical Diagnosis  Right ankle pain with dropfoot symptoms    Referring Provider (PT)  Maximiano Coss, NP    Onset Date/Surgical Date  03/28/19    Hand Dominance  Right    Prior Therapy  none       Precautions   Precautions  None      Restrictions   Weight Bearing Restrictions  No      Balance Screen   Has the patient fallen in the past 6 months  No      Prior Function   Level of Independence  Independent with community mobility without device      Cognition  Overall Cognitive Status  Within Functional Limits for tasks assessed      Observation/Other Assessments   Observations  bilat. ankle edema, figure 8 measurement 52 cm on right, 52.5 cm on left    Focus on Therapeutic Outcomes (FOTO)   36% limited      Sensation   Light Touch  Appears Intact      ROM / Strength   AROM / PROM / Strength  AROM;PROM;Strength      AROM   Overall AROM Comments  Lumbar and hip AROM grossly WFL without reproduction of any RLE radiating symptoms    AROM Assessment Site  Ankle    Right/Left Ankle  Right;Left    Right Ankle Dorsiflexion  4    Right Ankle Plantar Flexion  40    Right Ankle Inversion  40    Right Ankle Eversion  16    Left Ankle Dorsiflexion  11    Left Ankle Plantar Flexion  60    Left Ankle Inversion  45    Left Ankle Eversion  20      PROM   Overall PROM Comments  right ankle passive DF to 8 deg      Strength   Strength Assessment Site  Hip;Knee;Ankle    Right/Left Hip  Right;Left    Right Hip  Flexion  4+/5    Right Hip Extension  4/5    Right Hip External Rotation   5/5    Right Hip Internal Rotation  5/5    Right Hip ABduction  4+/5    Right Hip ADduction  4/5    Left Hip Flexion  4+/5    Left Hip External Rotation  5/5    Left Hip Internal Rotation  5/5    Right/Left Knee  Right;Left    Right Knee Flexion  5/5    Right Knee Extension  5/5    Left Knee Flexion  5/5    Left Knee Extension  5/5    Right/Left Ankle  Right;Left    Right Ankle Dorsiflexion  5/5    Right Ankle Plantar Flexion  5/5   supine only   Right Ankle Inversion  4+/5    Right Ankle Eversion  4+/5    Left Ankle Dorsiflexion  5/5    Left Ankle Plantar Flexion  5/5   supine only   Left Ankle Inversion  5/5    Left Ankle Eversion  4+/5      Palpation   Palpation comment  normal talocrural passive mobility, no symptom reproduction or significant tenderness with lumbar PAs      Special Tests   Other special tests  SLR (-)      Ambulation/Gait   Gait Comments  pes planus with overpronation, pt. has forefoot valgus and mild hallux valgua                Objective measurements completed on examination: See above findings.      [facility] PT Treatment/Exercise - 04/18/19 0001      Exercises   Exercises  Ankle      Ankle Exercises: Standing   Other Standing Ankle Exercises  HEP instruction standing vs. longsitting gastroc stretch and heel raises      Ankle Exercises: Supine   T-Band  EV, IV and DF x 10 ea. with green band             PT Education - 04/18/19 1231    Education Details  potential symptom etiology, eval  findings, HEP, footwear recommendations for supportive show with medial arch support    Person(s) Educated  Patient    Methods  Explanation;Demonstration;Verbal cues;Tactile cues;Handout    Comprehension  Verbalized understanding;Returned demonstration          PT Long Term Goals - 04/18/19 1639      PT LONG TERM GOAL #1   Title  Independent with HEP     Baseline  no HEP    Time  6    Period  Weeks    Status  New    Target Date  05/30/19      PT LONG TERM GOAL #2   Title  Increase right ankle DF AROM 5 deg or greater to improve toe clearance with gait and improve ankle ROM for driving at work    Baseline  4 deg    Time  6    Period  Weeks    Status  New    Target Date  05/30/19      PT LONG TERM GOAL #3   Title  Improve FOTO outcome measure score to 21% or less impairment due to right foot/ankle    Baseline  36%    Time  6    Period  Weeks    Status  New    Target Date  05/30/19      PT LONG TERM GOAL #4   Title  Right ankle strength 5/5 to improve ability to step in/out of work truck    Baseline  EV and IV 4+/5    Time  6    Period  Weeks    Status  New    Target Date  05/30/19      PT LONG TERM GOAL #5   Title  Perform work duties for driving, stepping in/out of truck with right ankle pain 2/10 or less    Baseline  4/10 or greater    Time  6    Period  Weeks    Status  New    Target Date  05/30/19             Plan - 04/18/19 1631    Clinical Impression Statement  Pt. presents with right ankle/foot pain and decreased ROM/referral for dropfoot symptoms. Pt. reports intermittent RLE radiating pain from buttock to lower leg but otherwise no significant findings with lumbar assessment to correlate with radicular etiology. Pt. with contributing foot biomechanics with forefoot valgus, pes planus and hallux valgus which could be contributing to pain in addition to findings of ankle decreased ROM and weakness, gastroc tightness with limited ankle DF AROM. Pt. would benefit from PT to help relieve pain and address current associated functional limitations for mobility.    Personal Factors and Comorbidities  Comorbidity 1    Comorbidities  obesity    Examination-Activity Limitations  Locomotion Level   driving   Examination-Participation Restrictions  Driving;Community Activity    Stability/Clinical Decision Making   Evolving/Moderate complexity    Clinical Decision Making  Moderate    Rehab Potential  Good    PT Frequency  --   1-2x/week   PT Duration  6 weeks    PT Treatment/Interventions  ADLs/Self Care Home Management;Cryotherapy;Ultrasound;Moist Heat;Electrical Stimulation;Gait training;Therapeutic exercise;Therapeutic activities;Functional mobility training;Neuromuscular re-education;Patient/family education;Manual techniques;Passive range of motion;Taping;Dry needling;Vasopneumatic Device    PT Next Visit Plan  Review HEP as needed, work on ankle DF ROM, ankle strengthening and heel cord stretching, balance/proprioceptive challenges, modalities prn for pain    PT Home Exercise  Plan  Theraband ankle DF, EV, IV, heel raises, gastroc stretch    Consulted and Agree with Plan of Care  Patient       Patient will benefit from skilled therapeutic intervention in order to improve the following deficits and impairments:  Abnormal gait, Difficulty walking, Decreased range of motion, Pain, Impaired flexibility, Decreased strength  Visit Diagnosis: Pain in right ankle and joints of right foot     Problem List Patient Active Problem List   Diagnosis Date Noted  . Acute pain of right shoulder 02/21/2018  . Right shoulder tendinitis 02/21/2018  . Musculoskeletal pain 02/21/2018  . Muscle spasm 02/21/2018  . HSV (herpes simplex virus) infection 01/26/2018  . Diverticulosis 06/21/2014  . Fibroid, uterine 06/21/2014  . GERD 08/06/2009  . MORBID OBESITY 05/26/2008  . Essential hypertension 02/19/2007   Beaulah Dinning, PT, DPT 04/18/19 4:47 PM  [facility] [address] [place], [place], [address] Phone: [phone]   Fax:  [phone]  Name: CLEATUS GOODIN MRN: [id number] Date of Birth: 1966-08-03

## 2019-04-19 DIAGNOSIS — Z1211 Encounter for screening for malignant neoplasm of colon: Secondary | ICD-10-CM | POA: Diagnosis not present

## 2019-04-20 DIAGNOSIS — Z20828 Contact with and (suspected) exposure to other viral communicable diseases: Secondary | ICD-10-CM | POA: Diagnosis not present

## 2019-05-16 ENCOUNTER — Ambulatory Visit: Payer: Federal, State, Local not specified - PPO | Admitting: Physical Therapy

## 2019-05-30 DIAGNOSIS — E119 Type 2 diabetes mellitus without complications: Secondary | ICD-10-CM | POA: Diagnosis not present

## 2019-05-30 DIAGNOSIS — Z01818 Encounter for other preprocedural examination: Secondary | ICD-10-CM | POA: Diagnosis not present

## 2019-05-30 DIAGNOSIS — Z1211 Encounter for screening for malignant neoplasm of colon: Secondary | ICD-10-CM | POA: Diagnosis not present

## 2019-06-06 DIAGNOSIS — I10 Essential (primary) hypertension: Secondary | ICD-10-CM | POA: Diagnosis not present

## 2019-06-06 DIAGNOSIS — Z113 Encounter for screening for infections with a predominantly sexual mode of transmission: Secondary | ICD-10-CM | POA: Diagnosis not present

## 2019-06-06 DIAGNOSIS — Z6838 Body mass index (BMI) 38.0-38.9, adult: Secondary | ICD-10-CM | POA: Diagnosis not present

## 2019-06-06 DIAGNOSIS — E119 Type 2 diabetes mellitus without complications: Secondary | ICD-10-CM | POA: Diagnosis not present

## 2019-06-06 DIAGNOSIS — Z0000 Encounter for general adult medical examination without abnormal findings: Secondary | ICD-10-CM | POA: Diagnosis not present

## 2019-08-22 DIAGNOSIS — R0602 Shortness of breath: Secondary | ICD-10-CM | POA: Diagnosis not present

## 2019-08-22 DIAGNOSIS — Z7189 Other specified counseling: Secondary | ICD-10-CM | POA: Diagnosis not present

## 2019-08-22 DIAGNOSIS — R9431 Abnormal electrocardiogram [ECG] [EKG]: Secondary | ICD-10-CM | POA: Diagnosis not present

## 2019-08-27 DIAGNOSIS — K080 Exfoliation of teeth due to systemic causes: Secondary | ICD-10-CM | POA: Diagnosis not present

## 2019-09-05 DIAGNOSIS — I10 Essential (primary) hypertension: Secondary | ICD-10-CM | POA: Diagnosis not present

## 2019-09-05 DIAGNOSIS — E1159 Type 2 diabetes mellitus with other circulatory complications: Secondary | ICD-10-CM | POA: Diagnosis not present

## 2019-09-05 DIAGNOSIS — Z6836 Body mass index (BMI) 36.0-36.9, adult: Secondary | ICD-10-CM | POA: Diagnosis not present

## 2019-09-05 DIAGNOSIS — E119 Type 2 diabetes mellitus without complications: Secondary | ICD-10-CM | POA: Diagnosis not present

## 2019-10-09 DIAGNOSIS — Z20828 Contact with and (suspected) exposure to other viral communicable diseases: Secondary | ICD-10-CM | POA: Diagnosis not present

## 2019-10-10 DIAGNOSIS — Z20828 Contact with and (suspected) exposure to other viral communicable diseases: Secondary | ICD-10-CM | POA: Diagnosis not present

## 2019-10-16 DIAGNOSIS — Z8619 Personal history of other infectious and parasitic diseases: Secondary | ICD-10-CM | POA: Diagnosis not present

## 2019-10-16 DIAGNOSIS — Z20828 Contact with and (suspected) exposure to other viral communicable diseases: Secondary | ICD-10-CM | POA: Diagnosis not present

## 2019-10-20 DIAGNOSIS — Z20828 Contact with and (suspected) exposure to other viral communicable diseases: Secondary | ICD-10-CM | POA: Diagnosis not present

## 2019-10-26 ENCOUNTER — Other Ambulatory Visit: Payer: Self-pay

## 2019-10-26 ENCOUNTER — Encounter (HOSPITAL_COMMUNITY): Payer: Self-pay

## 2019-10-26 ENCOUNTER — Emergency Department (HOSPITAL_COMMUNITY)
Admission: EM | Admit: 2019-10-26 | Discharge: 2019-10-26 | Disposition: A | Discharge status: Home / Self Care | Payer: Federal, State, Local not specified - PPO | Attending: Emergency Medicine | Admitting: Emergency Medicine

## 2019-10-26 DIAGNOSIS — I10 Essential (primary) hypertension: Secondary | ICD-10-CM | POA: Insufficient documentation

## 2019-10-26 DIAGNOSIS — Z79899 Other long term (current) drug therapy: Secondary | ICD-10-CM | POA: Insufficient documentation

## 2019-10-26 DIAGNOSIS — R0981 Nasal congestion: Secondary | ICD-10-CM | POA: Diagnosis not present

## 2019-10-26 DIAGNOSIS — Z20828 Contact with and (suspected) exposure to other viral communicable diseases: Secondary | ICD-10-CM | POA: Diagnosis not present

## 2019-10-26 MED ORDER — FLUTICASONE PROPIONATE 50 MCG/ACT NA SUSP: 1.0000 | Freq: Every day | NASAL | 2 refills | Status: DC

## 2019-10-26 NOTE — Discharge Instructions (Addendum)
It is important for you to take your lisinopril every day as prescribed. Follow-up with your primary care provider. Return to the ED if you start to have worsening symptoms, chest pain, shortness of breath, blurry vision, headache, numbness in arms or legs.

## 2019-10-26 NOTE — ED Triage Notes (Addendum)
Pt reports she was at [facility] earlier to be tested for COVID due to exposure and they said her BP was high. Pt already takes Lisinopril for BP. Pt reports she has a bit of a sinus infection and normally her BP goes up when she has any infection. Denies any other symptoms.

## 2019-10-26 NOTE — ED Provider Notes (Signed)
[name] DEPT Provider Note   CSN: [id number] Arrival date & time: 10/26/19  1630     History Chief Complaint  Patient presents with  . Hypertension    Sharon [name] is a 53 y.o. female with a past medical history of hypertension currently on lisinopril presenting to the ED via urgent care for hypertension.  Patient presented to the urgent care earlier today to be tested for Covid and concerns for sinus infection.  She was sent to the ED for blood pressure readings of 1 0000000 systolic.  She not remember if she took her lisinopril today and does admit that she does miss doses due to forgetfulness.  She has been on lisinopril for several years without issues.  She was concerned that her blood pressure was high because of an argument she had gotten with her daughter prior to arrival in the urgent care as well as sinus pressure from her sinus infection.  She states that her blood pressure usually runs high whenever she is dealing with an infection.  She denies any chest pain, shortness of breath, headache, vision changes.  She was tested for Covid at the urgent care and was prescribed an antibiotic for her sinusitis.  HPI     Past Medical History:  Diagnosis Date  . Allergy   . Hypertension   . Seasonal allergies     Patient Active Problem List   Diagnosis Date Noted  . Acute pain of right shoulder 02/21/2018  . Right shoulder tendinitis 02/21/2018  . Musculoskeletal pain 02/21/2018  . Muscle spasm 02/21/2018  . HSV (herpes simplex virus) infection 01/26/2018  . Diverticulosis 06/21/2014  . Fibroid, uterine 06/21/2014  . GERD 08/06/2009  . MORBID OBESITY 05/26/2008  . Essential hypertension 02/19/2007    Past Surgical History:  Procedure Laterality Date  . BREAST EXCISIONAL BIOPSY Bilateral 1978  . BREAST SURGERY       OB History   No obstetric history on file.     Family History  Problem Relation Age of Onset  . Diabetes Mother     . Hypertension Father   . Hypertension Sister   . Breast cancer Maternal Aunt     Social History   Tobacco Use  . Smoking status: Never Smoker  . Smokeless tobacco: Never Used  Substance Use Topics  . Alcohol use: No    Alcohol/week: 0.0 standard drinks  . Drug use: No    Home Medications Prior to Admission medications   Medication Sig Start Date End Date Taking? Authorizing Provider  azelastine (OPTIVAR) 0.05 % ophthalmic solution Place 1 drop into both eyes 2 (two) times daily. As needed for allergy symptoms 03/21/19   Maximiano Coss, NP  cetirizine (ZYRTEC) 10 MG tablet Take 1 tablet (10 mg total) by mouth daily. 02/19/17   Ivar Drape D, PA  cyclobenzaprine (FLEXERIL) 5 MG tablet Take 1 tablet (5 mg total) by mouth 3 (three) times daily as needed for muscle spasms. Patient not taking: Reported on 04/18/2019 07/18/18   Tenna Delaine D, PA-C  fluticasone [facility]) 50 MCG/ACT nasal spray Place 1 spray into both nostrils daily. 10/26/19   Bridgid Printz, PA-C  lisinopril-hydrochlorothiazide (ZESTORETIC) 20-25 MG tablet Take 1 tablet by mouth daily. 03/21/19   Maximiano Coss, NP  loratadine (CLARITIN) 10 MG tablet Take 1 tablet (10 mg total) by mouth daily. 10/27/16 10/27/17  Jaynee Eagles, PA-C  montelukast (SINGULAIR) 10 MG tablet Take 1 tablet (10 mg total) by mouth at bedtime. 01/26/18  Weber, Damaris Hippo, PA-C  naproxen (NAPROSYN) 500 MG tablet Take 1 tablet (500 mg total) by mouth 2 (two) times daily with a meal. Patient not taking: Reported on 04/18/2019 07/18/18   Tenna Delaine D, PA-C  olopatadine (PATANOL) 0.1 % ophthalmic solution Place 1 drop into both eyes 2 (two) times daily. Patient not taking: Reported on 04/18/2019 02/19/17   Ivar Drape D, PA  phentermine 30 MG capsule Take by mouth daily. 02/27/19   [provider]  triamcinolone cream (KENALOG) 0.5 % Apply 1 application topically 2 (two) times daily. Patient not taking: Reported on 04/18/2019  03/05/18   Weber, Damaris Hippo, PA-C  valACYclovir (VALTREX) 500 MG tablet TAKE 1 TABLET BY MOUTH 2 TIMES DIALY FOR 3 DAYS AS NEEDED OUTBREAK 03/21/19   Maximiano Coss, NP    Allergies    Codeine  Review of Systems   Review of Systems  Constitutional: Negative for chills and fever.  HENT: Positive for congestion, sinus pressure and sinus pain.   Eyes: Negative for visual disturbance.  Respiratory: Negative for shortness of breath.   Cardiovascular: Negative for chest pain.  Neurological: Negative for weakness, numbness and headaches.    Physical Exam Updated Vital Signs BP (!) 160/108   Pulse 62   Temp 97.6 F (36.4 C) (Oral)   Resp 18   LMP 03/01/2015   SpO2 97%   Physical Exam Vitals and nursing note reviewed.  Constitutional:      General: She is not in acute distress.    Appearance: She is well-developed. She is not diaphoretic.  HENT:     Head: Normocephalic and atraumatic.  Eyes:     General: No scleral icterus.    Conjunctiva/sclera: Conjunctivae normal.  Cardiovascular:     Rate and Rhythm: Normal rate and regular rhythm.     Heart sounds: Normal heart sounds.  Pulmonary:     Effort: Pulmonary effort is normal. No respiratory distress.     Breath sounds: Normal breath sounds.  Musculoskeletal:     Cervical back: Normal range of motion.  Skin:    Findings: No rash.  Neurological:     General: No focal deficit present.     Mental Status: She is alert and oriented to person, place, and time.     Cranial Nerves: No cranial nerve deficit.     Motor: No weakness.     ED Results / Procedures / Treatments   Labs (all labs ordered are listed, but only abnormal results are displayed) Labs Reviewed - No data to display  EKG None  Radiology No results found.  Procedures Procedures (including critical care time)  Medications Ordered in ED Medications - No data to display  ED Course  I have reviewed the triage vital signs and the nursing  notes.  Pertinent labs & imaging results that were available during my care of the patient were reviewed by me and considered in my medical decision making (see chart for details).    MDM Rules/Calculators/A&P                      Patient's blood pressure initially elevated in the emergency department today. Not nearly as high as it was at urgent care. Patient denies headache, change in vision, numbness, weakness, chest pain, dyspnea, dizziness, or lightheadedness therefore doubt hypertensive emergency. Discussed elevated blood pressure with the patient and the need for primary care follow up with potential need to initiate or change antihypertensive medications and or for further  evaluation. Discussed return precaution signs/symptoms for hypertensive emergency as listed above with the patient. Will prescribe Flonase per patient's request.  Patient is hemodynamically stable, in NAD, and able to ambulate in the ED. Evaluation does not show pathology that would require ongoing emergent intervention or inpatient treatment. I explained the diagnosis to the patient. Pain has been managed and has no complaints prior to discharge. Patient is comfortable with above plan and is stable for discharge at this time. All questions were answered prior to disposition. Strict return precautions for returning to the ED were discussed. Encouraged follow up with PCP.   An After Visit Summary was printed and given to the patient.   Portions of this note were generated with [employer]. Dictation errors may occur despite best attempts at proofreading.  Final Clinical Impression(s) / ED Diagnoses Final diagnoses:  Essential hypertension  Sinus congestion    Rx / DC Orders ED Discharge Orders         Ordered    fluticasone (FLONASE) 50 MCG/ACT nasal spray  Daily     10/26/19 1737           Delia Heady, PA-C 10/26/19 1738    Charlesetta Shanks, MD 10/29/19 1733

## 2019-10-29 DIAGNOSIS — J019 Acute sinusitis, unspecified: Secondary | ICD-10-CM | POA: Diagnosis not present

## 2019-10-29 DIAGNOSIS — U071 COVID-19: Secondary | ICD-10-CM | POA: Diagnosis not present

## 2019-10-29 DIAGNOSIS — E1159 Type 2 diabetes mellitus with other circulatory complications: Secondary | ICD-10-CM | POA: Diagnosis not present

## 2019-10-29 DIAGNOSIS — I10 Essential (primary) hypertension: Secondary | ICD-10-CM | POA: Diagnosis not present

## 2019-11-01 DIAGNOSIS — Z20828 Contact with and (suspected) exposure to other viral communicable diseases: Secondary | ICD-10-CM | POA: Diagnosis not present

## 2019-11-04 DIAGNOSIS — I10 Essential (primary) hypertension: Secondary | ICD-10-CM | POA: Diagnosis not present

## 2019-11-04 DIAGNOSIS — E1159 Type 2 diabetes mellitus with other circulatory complications: Secondary | ICD-10-CM | POA: Diagnosis not present

## 2019-11-04 DIAGNOSIS — H9011 Conductive hearing loss, unilateral, right ear, with unrestricted hearing on the contralateral side: Secondary | ICD-10-CM | POA: Diagnosis not present

## 2019-11-04 DIAGNOSIS — H6121 Impacted cerumen, right ear: Secondary | ICD-10-CM | POA: Diagnosis not present

## 2020-06-16 ENCOUNTER — Other Ambulatory Visit: Payer: Self-pay | Admitting: Physician Assistant

## 2020-06-16 DIAGNOSIS — R921 Mammographic calcification found on diagnostic imaging of breast: Secondary | ICD-10-CM

## 2020-06-19 ENCOUNTER — Encounter (HOSPITAL_COMMUNITY): Payer: Self-pay | Admitting: Emergency Medicine

## 2020-06-19 ENCOUNTER — Other Ambulatory Visit: Payer: Self-pay

## 2020-06-19 ENCOUNTER — Emergency Department (HOSPITAL_COMMUNITY)
Admission: EM | Admit: 2020-06-19 | Discharge: 2020-06-20 | Disposition: A | Discharge status: Home / Self Care | Payer: Federal, State, Local not specified - PPO | Attending: Emergency Medicine | Admitting: Emergency Medicine

## 2020-06-19 DIAGNOSIS — K529 Noninfective gastroenteritis and colitis, unspecified: Secondary | ICD-10-CM | POA: Diagnosis not present

## 2020-06-19 DIAGNOSIS — R109 Unspecified abdominal pain: Secondary | ICD-10-CM | POA: Diagnosis present

## 2020-06-19 DIAGNOSIS — I10 Essential (primary) hypertension: Secondary | ICD-10-CM | POA: Insufficient documentation

## 2020-06-19 DIAGNOSIS — R110 Nausea: Secondary | ICD-10-CM | POA: Diagnosis not present

## 2020-06-19 DIAGNOSIS — Z79899 Other long term (current) drug therapy: Secondary | ICD-10-CM | POA: Insufficient documentation

## 2020-06-19 DIAGNOSIS — K625 Hemorrhage of anus and rectum: Secondary | ICD-10-CM | POA: Diagnosis not present

## 2020-06-19 LAB — TYPE AND SCREEN
ABO/RH(D): B POS
Antibody Screen: NEGATIVE

## 2020-06-19 LAB — COMPREHENSIVE METABOLIC PANEL
ALT: 16 U/L (ref 0–44)
AST: 18 U/L (ref 15–41)
Albumin: 4.1 g/dL (ref 3.5–5.0)
Alkaline Phosphatase: 62 U/L (ref 38–126)
Anion gap: 10 (ref 5–15)
BUN: 12 mg/dL (ref 6–20)
CO2: 28 mmol/L (ref 22–32)
Calcium: 9.9 mg/dL (ref 8.9–10.3)
Chloride: 103 mmol/L (ref 98–111)
Creatinine, Ser: 1.05 mg/dL — ABNORMAL HIGH (ref 0.44–1.00)
GFR calc Af Amer: >60 mL/min (ref >60)
GFR calc non Af Amer: 59 mL/min — ABNORMAL LOW (ref >60)
Glucose, Bld: 109 mg/dL — ABNORMAL HIGH (ref 70–99)
Potassium: 3.9 mmol/L (ref 3.5–5.1)
Sodium: 141 mmol/L (ref 135–145)
Total Bilirubin: 0.9 mg/dL (ref 0.3–1.2)
Total Protein: 7.2 g/dL (ref 6.5–8.1)

## 2020-06-19 LAB — CBC
HCT: 44.6 % (ref 36.0–46.0)
Hemoglobin: 13.7 g/dL (ref 12.0–15.0)
MCH: 25.9 pg — ABNORMAL LOW (ref 26.0–34.0)
MCHC: 30.7 g/dL (ref 30.0–36.0)
MCV: 84.5 fL (ref 80.0–100.0)
Platelets: 266 10*3/uL (ref 150–400)
RBC: 5.28 MIL/uL — ABNORMAL HIGH (ref 3.87–5.11)
RDW: 14.8 % (ref 11.5–15.5)
WBC: 7.2 10*3/uL (ref 4.0–10.5)
nRBC: 0 % (ref 0.0–0.2)

## 2020-06-19 LAB — I-STAT BETA HCG BLOOD, ED (MC, WL, AP ONLY): I-stat hCG, quantitative: <5 m[IU]/mL (ref <5)

## 2020-06-19 NOTE — ED Triage Notes (Addendum)
Pt reports pain at a "knot" just below umbilicus for a few weeks.  Pain worse after eating.  Reports nausea, vomiting, and diarrhea.  Symptoms worse since yesterday.  States 2 hrs ago she had large amount of bright red rectal bleeding with a large "ball" from her rectum.  States pain has improved some and "knot" is no longer there since episode of rectal bleeding.

## 2020-06-20 ENCOUNTER — Encounter (HOSPITAL_COMMUNITY): Payer: Self-pay | Admitting: Radiology

## 2020-06-20 ENCOUNTER — Emergency Department (HOSPITAL_COMMUNITY): Payer: Federal, State, Local not specified - PPO

## 2020-06-20 LAB — POC OCCULT BLOOD, ED: Fecal Occult Bld: POSITIVE — AB

## 2020-06-20 MED ORDER — DICYCLOMINE HCL 20 MG PO TABS: 20.0000 mg | ORAL_TABLET | Freq: Two times a day (BID) | ORAL | 0 refills | Status: AC

## 2020-06-20 MED ORDER — IOHEXOL 300 MG/ML  SOLN
100.0000 mL | Freq: Once | INTRAMUSCULAR | Status: AC | PRN
  Administered 2020-06-20: 100 mL via INTRAVENOUS

## 2020-06-20 MED ORDER — CIPROFLOXACIN HCL 500 MG PO TABS: 500.0000 mg | ORAL_TABLET | Freq: Two times a day (BID) | ORAL | 0 refills | Status: AC

## 2020-06-20 MED ORDER — METRONIDAZOLE 500 MG PO TABS: 500.0000 mg | ORAL_TABLET | Freq: Two times a day (BID) | ORAL | 0 refills | Status: AC

## 2020-06-20 MED ORDER — ONDANSETRON 4 MG PO TBDP: 4.0000 mg | ORAL_TABLET | Freq: Three times a day (TID) | ORAL | 0 refills | Status: AC | PRN

## 2020-06-20 NOTE — Discharge Instructions (Signed)
There was evidence of inflammation on the abdominal CT, consistent with colitis.  Please take all of your antibiotics until finished!   You may develop abdominal discomfort or diarrhea from the antibiotic.  You may help offset this with probiotics which you can buy or get in yogurt. Do not eat or take the probiotics until 2 hours after your antibiotic.   Nausea/vomiting: Use the ondansetron (generic for Zofran) for nausea or vomiting.  This medication may not prevent all vomiting or nausea, but can help facilitate better hydration. Things that can help with nausea/vomiting also include peppermint/menthol candies, vitamin B12, and ginger. Dicyclomine: Dicyclomine (generic for Bentyl) is what is known as an antispasmodic and is intended to help reduce abdominal discomfort.  Highly recommend follow-up with primary care provider and gastroenterology on this matter.  Return for uncontrolled and continuous rectal bleeding, uncontrolled vomiting, chest pain, shortness of breath, persistent abdominal pain, or any other major concerns.

## 2020-06-20 NOTE — ED Notes (Signed)
Patient verbalizes understanding of discharge instructions. Opportunity for questioning and answers were provided. Armband removed by staff, pt discharged from ED ambulatory.   

## 2020-06-20 NOTE — ED Provider Notes (Signed)
[name] Provider Note   CSN: [id number] Arrival date & time: 06/19/20  1743     History Chief Complaint  Patient presents with  . Abdominal Pain  . Rectal Bleeding    [name] is a 54 y.o. female.  HPI      [name] is a 54 y.o. female, with a history of HTN, presenting to the ED with abdominal pain beginning yesterday. She states she ate some food and shortly thereafter began to have periumbilical cramping, waxing and waning, severe, nonradiating.  She then had the urge to have a bowel movement, but she states she then passed a "large amount of blood."  She has not had a repeat incidence of rectal bleeding.  She has not experienced this before. Her pain significantly improved following the passage of blood.  She continues to have some nausea. Denies anticoagulation. Denies vomiting, fever, melena, chest pain, shortness of breath, syncope, dizziness, or any other complaints.  Past Medical History:  Diagnosis Date  . Allergy   . Hypertension   . Seasonal allergies     Patient Active Problem List   Diagnosis Date Noted  . Acute pain of right shoulder 02/21/2018  . Right shoulder tendinitis 02/21/2018  . Musculoskeletal pain 02/21/2018  . Muscle spasm 02/21/2018  . HSV (herpes simplex virus) infection 01/26/2018  . Diverticulosis 06/21/2014  . Fibroid, uterine 06/21/2014  . GERD 08/06/2009  . MORBID OBESITY 05/26/2008  . Essential hypertension 02/19/2007    Past Surgical History:  Procedure Laterality Date  . BREAST EXCISIONAL BIOPSY Bilateral 1978  . BREAST SURGERY       OB History   No obstetric history on file.     Family History  Problem Relation Age of Onset  . Diabetes Mother   . Hypertension Father   . Hypertension Sister   . Breast cancer Maternal Aunt     Social History   Tobacco Use  . Smoking status: Never Smoker  . Smokeless tobacco: Never Used  Vaping Use  . Vaping Use: Never used   Substance Use Topics  . Alcohol use: No    Alcohol/week: 0.0 standard drinks  . Drug use: No    Home Medications Prior to Admission medications   Medication Sig Start Date End Date Taking? Authorizing Provider  azelastine (OPTIVAR) 0.05 % ophthalmic solution Place 1 drop into both eyes 2 (two) times daily. As needed for allergy symptoms 03/21/19   Maximiano Coss, NP  cetirizine (ZYRTEC) 10 MG tablet Take 1 tablet (10 mg total) by mouth daily. 02/19/17   Ivar Drape D, PA  ciprofloxacin (CIPRO) 500 MG tablet Take 1 tablet (500 mg total) by mouth 2 (two) times daily for 10 days. 06/20/20 06/30/20  Herrick Hartog Dannenberg C, PA-C  cyclobenzaprine (FLEXERIL) 5 MG tablet Take 1 tablet (5 mg total) by mouth 3 (three) times daily as needed for muscle spasms. Patient not taking: Reported on 04/18/2019 07/18/18   Tenna Delaine D, PA-C  dicyclomine (BENTYL) 20 MG tablet Take 1 tablet (20 mg total) by mouth 2 (two) times daily. 06/20/20   Frayda Egley C, PA-C  fluticasone (FLONASE) 50 MCG/ACT nasal spray Place 1 spray into both nostrils daily. 10/26/19   Khatri, Hina, PA-C  lisinopril-hydrochlorothiazide (ZESTORETIC) 20-25 MG tablet Take 1 tablet by mouth daily. 03/21/19   Maximiano Coss, NP  loratadine (CLARITIN) 10 MG tablet Take 1 tablet (10 mg total) by mouth daily. 10/27/16 10/27/17  Jaynee Eagles, PA-C  metroNIDAZOLE (FLAGYL)  500 MG tablet Take 1 tablet (500 mg total) by mouth 2 (two) times daily for 10 days. 06/20/20 06/30/20  Trenita Hulme C, PA-C  montelukast (SINGULAIR) 10 MG tablet Take 1 tablet (10 mg total) by mouth at bedtime. 01/26/18   Gale Journey, Damaris Hippo, PA-C  naproxen (NAPROSYN) 500 MG tablet Take 1 tablet (500 mg total) by mouth 2 (two) times daily with a meal. Patient not taking: Reported on 04/18/2019 07/18/18   Tenna Delaine D, PA-C  olopatadine (PATANOL) 0.1 % ophthalmic solution Place 1 drop into both eyes 2 (two) times daily. Patient not taking: Reported on 04/18/2019 02/19/17   Ivar Drape  D, PA  ondansetron (ZOFRAN ODT) 4 MG disintegrating tablet Take 1 tablet (4 mg total) by mouth every 8 (eight) hours as needed for nausea or vomiting. 06/20/20   Melesio Madara C, PA-C  phentermine 30 MG capsule Take by mouth daily. 02/27/19   [provider]  triamcinolone cream (KENALOG) 0.5 % Apply 1 application topically 2 (two) times daily. Patient not taking: Reported on 04/18/2019 03/05/18   Weber, Damaris Hippo, PA-C  valACYclovir (VALTREX) 500 MG tablet TAKE 1 TABLET BY MOUTH 2 TIMES DIALY FOR 3 DAYS AS NEEDED OUTBREAK 03/21/19   Maximiano Coss, NP    Allergies    Codeine  Review of Systems   Review of Systems  Constitutional: Negative for chills, diaphoresis and fever.  Respiratory: Negative for cough and shortness of breath.   Cardiovascular: Negative for chest pain.  Gastrointestinal: Positive for abdominal pain, blood in stool and nausea. Negative for rectal pain and vomiting.  Genitourinary: Negative for dysuria, hematuria and vaginal bleeding.  Musculoskeletal: Negative for back pain.  Neurological: Negative for dizziness, syncope and weakness.  All other systems reviewed and are negative.   Physical Exam Updated Vital Signs BP (!) 145/78 (BP Location: Right Arm)   Pulse (!) 54   Temp 98.5 F (36.9 C) (Oral)   Resp 12   LMP 03/01/2015   SpO2 97%   Physical Exam Vitals and nursing note reviewed.  Constitutional:      General: She is not in acute distress.    Appearance: She is well-developed. She is not diaphoretic.  HENT:     Head: Normocephalic and atraumatic.     Mouth/Throat:     Mouth: Mucous membranes are moist.     Pharynx: Oropharynx is clear.  Eyes:     Conjunctiva/sclera: Conjunctivae normal.  Cardiovascular:     Rate and Rhythm: Normal rate and regular rhythm.     Pulses: Normal pulses.          Radial pulses are 2+ on the right side and 2+ on the left side.       Posterior tibial pulses are 2+ on the right side and 2+ on the left side.     Heart  sounds: Normal heart sounds.     Comments: Tactile temperature in the extremities appropriate and equal bilaterally. Pulmonary:     Effort: Pulmonary effort is normal. No respiratory distress.     Breath sounds: Normal breath sounds.  Abdominal:     Palpations: Abdomen is soft.     Tenderness: There is abdominal tenderness in the periumbilical area. There is no guarding.  Genitourinary:    Rectum: Guaiac result positive.     Comments: Small amount of frank bright red blood on rectal exam.  No noted free-flowing hemorrhage.  No rectal tenderness.  No stool burden.   No noted hemorrhoids, fissures, foreign bodies,  masses, or signs of trauma. Musculoskeletal:     Cervical back: Neck supple.     Right lower leg: No edema.     Left lower leg: No edema.  Lymphadenopathy:     Cervical: No cervical adenopathy.  Skin:    General: Skin is warm and dry.  Neurological:     Mental Status: She is alert.  Psychiatric:        Mood and Affect: Mood and affect normal.        Speech: Speech normal.        Behavior: Behavior normal.     ED Results / Procedures / Treatments   Labs (all labs ordered are listed, but only abnormal results are displayed) Labs Reviewed  COMPREHENSIVE METABOLIC PANEL - Abnormal; Notable for the following components:      Result Value   Glucose, Bld 109 (*)    Creatinine, Ser 1.05 (*)    GFR calc non Af Amer 59 (*)    All other components within normal limits  CBC - Abnormal; Notable for the following components:   RBC 5.28 (*)    MCH 25.9 (*)    All other components within normal limits  POC OCCULT BLOOD, ED - Abnormal; Notable for the following components:   Fecal Occult Bld POSITIVE (*)    All other components within normal limits  I-STAT BETA HCG BLOOD, ED ([facility], [facility], [facility] ONLY)  TYPE AND SCREEN    EKG None  Radiology No results found.   CT ABDOMEN PELVIS W CONTRAST  Result Date: 06/20/2020 CLINICAL DATA:  Diffuse abdominal pain.  Nausea and vomiting.  EXAM: CT ABDOMEN AND PELVIS WITH CONTRAST TECHNIQUE: Multidetector CT imaging of the abdomen and pelvis was performed using the standard protocol following bolus administration of intravenous contrast. CONTRAST:  135mL OMNIPAQUE IOHEXOL 300 MG/ML  SOLN COMPARISON:  CT abdomen pelvis 06/05/2013. FINDINGS: Lower chest: Normal heart size. Dependent atelectasis bilateral lower lobes. No pleural effusion. Hepatobiliary: Liver is normal in size and contour. Within the posterior right hepatic lobe there is a 6.0 x 3.8 cm cyst (image 25; series 3). There is additional subcentimeter too small to characterize low-attenuation lesion right hepatic lobe (image 31; series 3). Gallbladder is unremarkable. Pancreas: Unremarkable Spleen: Unremarkable Adrenals/Urinary Tract: Normal adrenal glands. Kidneys enhance symmetrically with contrast. No hydronephrosis. Urinary bladder is unremarkable. Stomach/Bowel: Circumferential wall thickening of the distal transverse colon to the mid descending colon involving the splenic flexure. Normal appendix. No evidence for small bowel obstruction. Normal morphology of the stomach. Vascular/Lymphatic: Normal caliber abdominal aorta. No retroperitoneal lymphadenopathy. Reproductive: Uterus and adnexal structures are unremarkable. Other: None. Musculoskeletal: Lumbar spine degenerative changes. No aggressive or acute appearing osseous lesions. IMPRESSION: Circumferential wall thickening of the distal transverse colon to the mid descending colon involving the splenic flexure most compatible with colitis. Considerations include infectious, inflammatory or ischemic etiologies. Electronically Signed   By: Lovey Newcomer M.D.   On: 06/20/2020 12:44    Procedures Procedures (including critical care time)  Medications Ordered in ED Medications  iohexol (OMNIPAQUE) 300 MG/ML solution 100 mL (100 mLs Intravenous Contrast Given 06/20/20 1219)    ED Course  I have reviewed the triage vital signs and the  nursing notes.  Pertinent labs & imaging results that were available during my care of the patient were reviewed by me and considered in my medical decision making (see chart for details).  Clinical Course as of Jun 22 1330  Nancy Fetter Jun 20, 2020  1341 Spoke with  Dr. Alessandra Bevels, [facility]. Discussed the patient's symptoms, physical exam findings, and imaging results. Recommends starting the patient on Cipro and Flagyl for 10 to 14 days.  She may follow-up with them in the office.   [SJ]    Clinical Course User Index [SJ] Quintina Hakeem, Helane Gunther, PA-C   MDM Rules/Calculators/A&P                          Patient presents with complaint of abdominal pain and rectal bleeding. Patient is nontoxic appearing, afebrile, not tachycardic, not tachypneic, not hypotensive, maintains excellent SPO2 on room air, and is in no apparent distress.   I have reviewed the patient's chart to obtain more information.   I reviewed and interpreted the patient's labs and radiological studies. No leukocytosis.  No signs of anemia. Small amount of bright red blood on rectal exam. Signs of colitis on CT. Recommend GI follow-up.  Resources given. The patient was given instructions for home care as well as return precautions. Patient voices understanding of these instructions, accepts the plan, and is comfortable with discharge.   Vitals:   06/20/20 0731 06/20/20 0847 06/20/20 1330 06/20/20 1412  BP: (!) 149/81 (!) 145/78 (!) 146/103   Pulse: (!) 51 (!) 54 (!) 59   Resp: 12 12    Temp: 98.6 F (37 C) 98.5 F (36.9 C)  98.1 F (36.7 C)  TempSrc: Oral Oral  Oral  SpO2: 99% 97% 100%    Final Clinical Impression(s) / ED Diagnoses Final diagnoses:  Colitis  Rectal bleeding    Rx / DC Orders ED Discharge Orders         Ordered    ondansetron (ZOFRAN ODT) 4 MG disintegrating tablet  Every 8 hours PRN     Discontinue  Reprint     06/20/20 1402    dicyclomine (BENTYL) 20 MG tablet  2 times daily     Discontinue  Reprint      06/20/20 1402    ciprofloxacin (CIPRO) 500 MG tablet  2 times daily     Discontinue  Reprint     06/20/20 1402    metroNIDAZOLE (FLAGYL) 500 MG tablet  2 times daily     Discontinue  Reprint     06/20/20 1402           Lorayne Bender, PA-C 06/22/20 1333    Blanchie Dessert, MD 06/24/20 2237

## 2020-07-26 ENCOUNTER — Other Ambulatory Visit: Payer: Self-pay

## 2020-07-26 ENCOUNTER — Ambulatory Visit
Admission: RE | Admit: 2020-07-26 | Discharge: 2020-07-26 | Disposition: A | Discharge status: Home / Self Care | Payer: Federal, State, Local not specified - PPO | Source: Ambulatory Visit | Attending: Physician Assistant | Admitting: Physician Assistant

## 2020-07-26 ENCOUNTER — Other Ambulatory Visit: Payer: Self-pay | Admitting: Physician Assistant

## 2020-07-26 DIAGNOSIS — R921 Mammographic calcification found on diagnostic imaging of breast: Secondary | ICD-10-CM

## 2020-09-24 IMAGING — MG DIGITAL DIAGNOSTIC BILATERAL MAMMOGRAM WITH TOMO AND CAD
6 of 10 series · 6 of 26 positions shown · non-contrast
Comparison: Previous exam(s).

CLINICAL DATA: 53-year-old female presenting for annual bilateral
mammogram in 1 year follow-up of probably benign left breast
calcifications. The patient states she has an intermittent lump in
the right breast for over a year. She cannot find that area today.

EXAM:
DIGITAL DIAGNOSTIC BILATERAL MAMMOGRAM WITH CAD AND TOMO

[L ML]
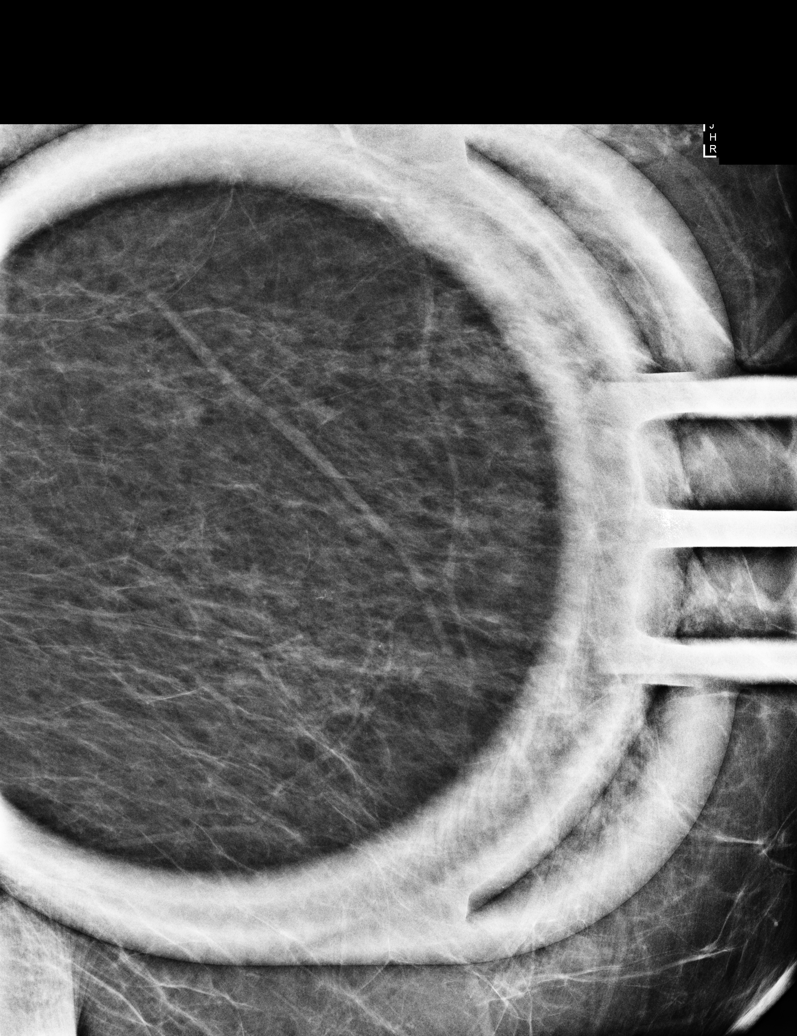

[L CC]
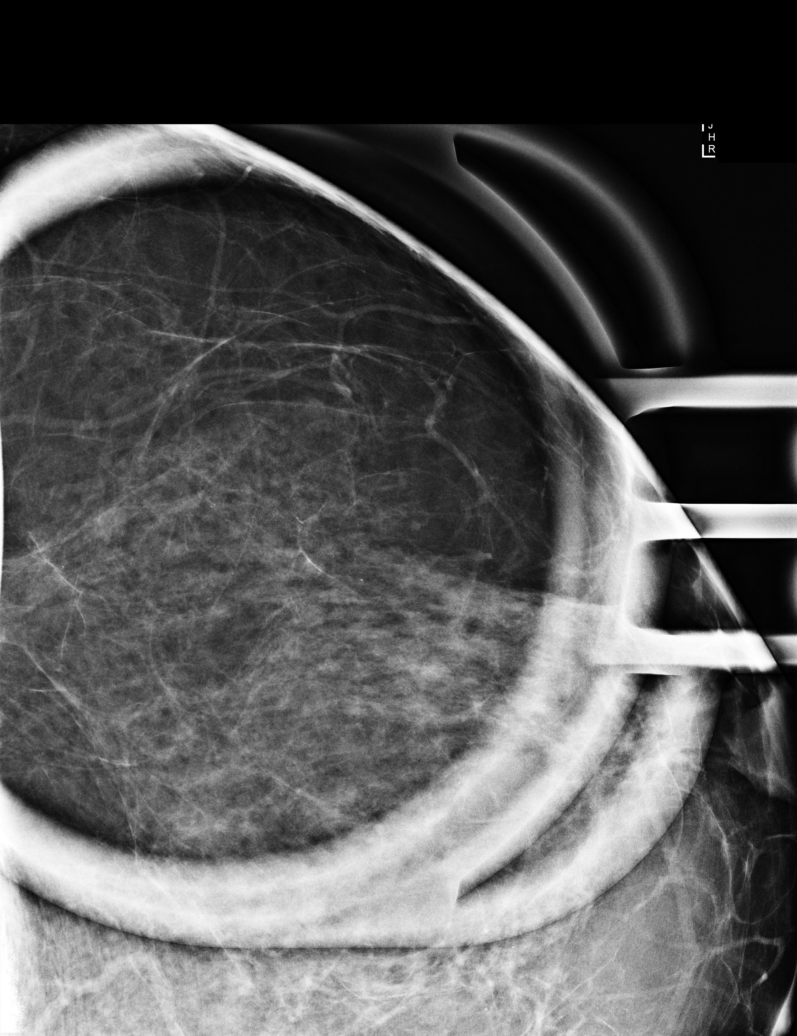

[L MLO synth-2D]
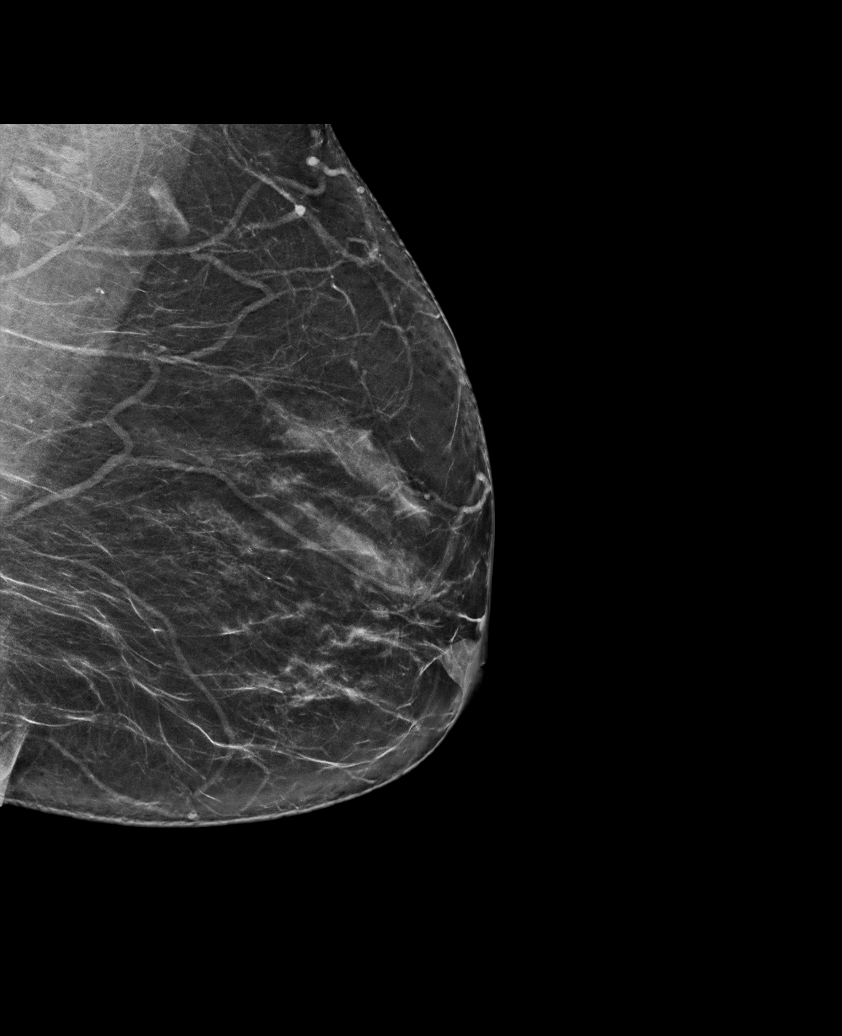

[L CC synth-2D]
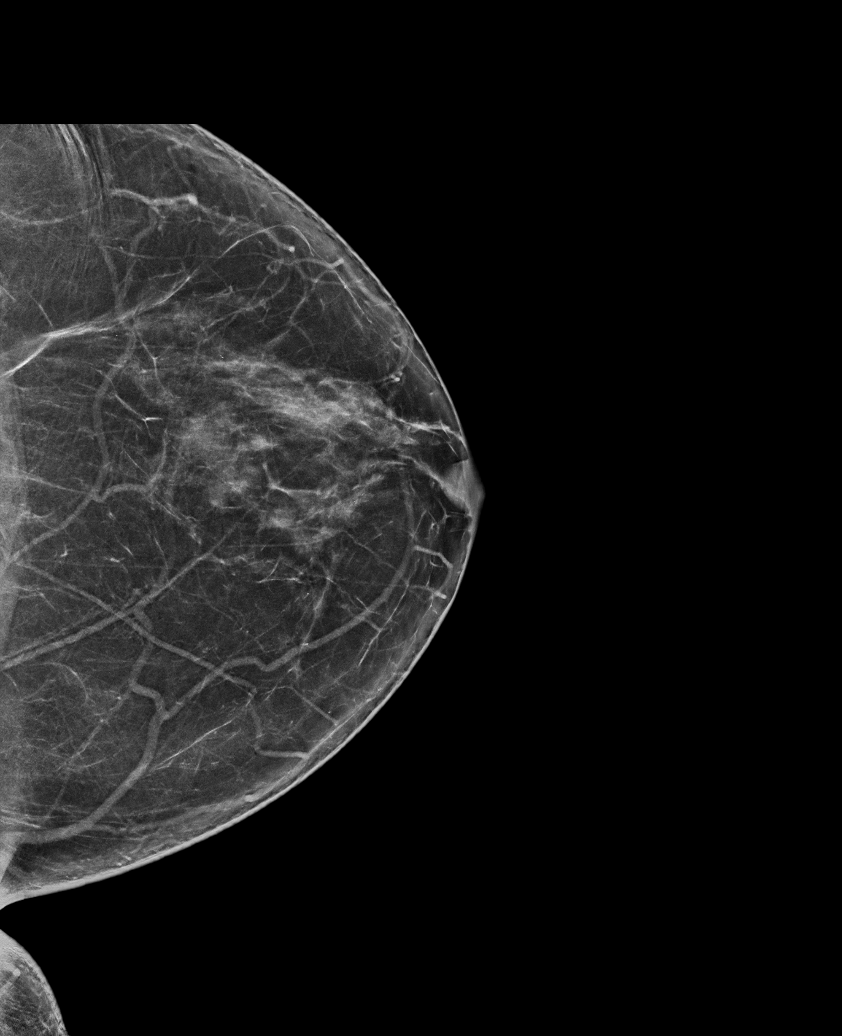

[R CC synth-2D]
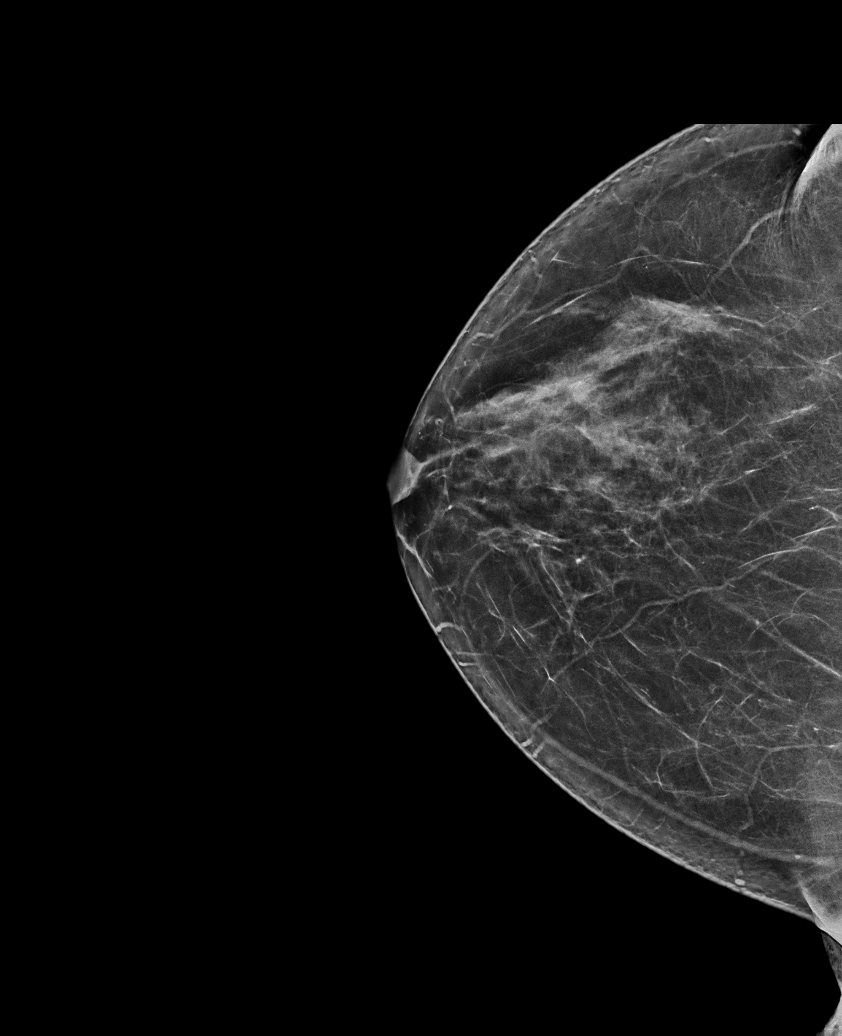

[R MLO synth-2D]
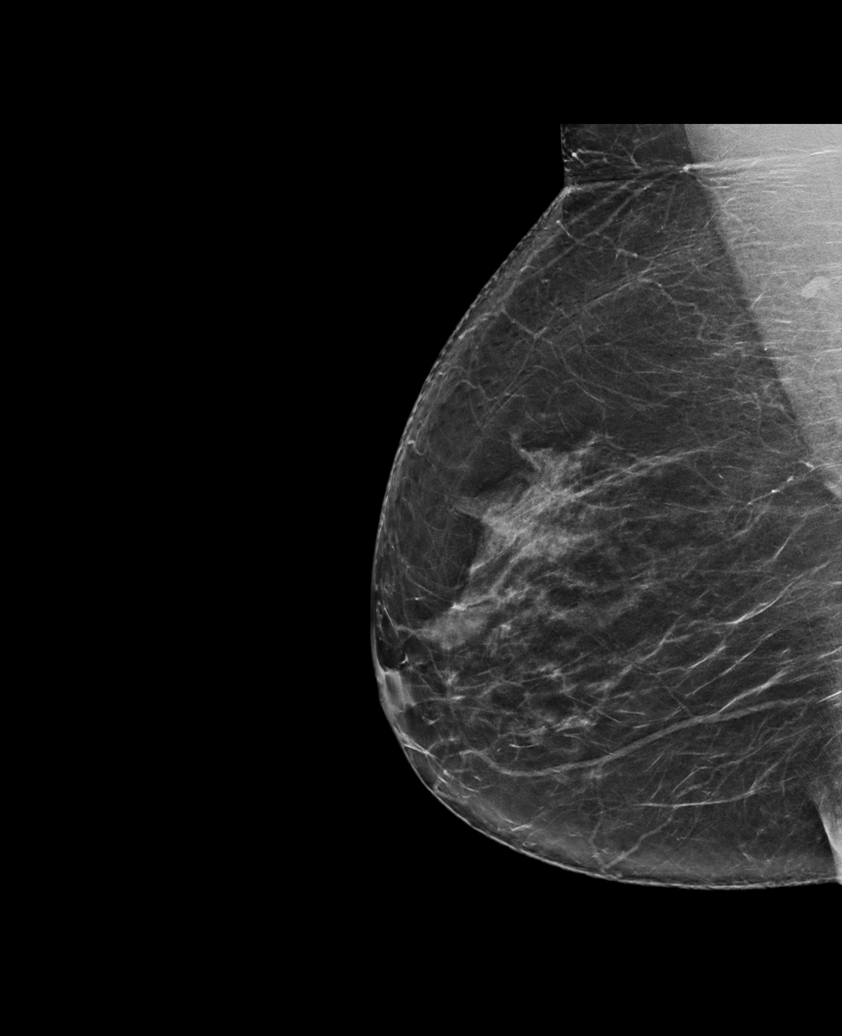

[6 of 26 positions shown; findings below may reference images not displayed]

ACR Breast Density Category b: There are scattered areas of
fibroglandular density.
FINDINGS: Calcifications in the lateral left breast are mammographically
stable. No additional new or suspicious findings are identified in
either breast.

Mammographic images were processed with CAD.
IMPRESSION: 1. Stable, probably benign left breast calcifications. Recommend a
final follow-up in 1 year to coincide with the patient's annual
bilateral mammogram.
2. No mammographic evidence of malignancy on the right. Recommend
clinical and symptomatic follow-up for the patient's intermittent
right breast lump.

RECOMMENDATION:
Bilateral diagnostic mammogram in 1 year.

I have discussed the findings and recommendations with the patient.
Results were also provided in writing at the conclusion of the
visit. If applicable, a reminder letter will be sent to the patient
regarding the next appointment.

BI-RADS CATEGORY  3: Probably benign.

## 2022-08-14 ENCOUNTER — Ambulatory Visit
Admission: EM | Admit: 2022-08-14 | Discharge: 2022-08-14 | Disposition: A | Discharge status: Home / Self Care | Payer: Federal, State, Local not specified - PPO | Attending: Urgent Care | Admitting: Urgent Care

## 2022-08-14 DIAGNOSIS — J309 Allergic rhinitis, unspecified: Secondary | ICD-10-CM

## 2022-08-14 DIAGNOSIS — J329 Chronic sinusitis, unspecified: Secondary | ICD-10-CM

## 2022-08-14 MED ORDER — AMOXICILLIN-POT CLAVULANATE 875-125 MG PO TABS: 1.0000 | ORAL_TABLET | Freq: Two times a day (BID) | ORAL | 0 refills | Status: DC

## 2022-08-14 MED ORDER — PSEUDOEPHEDRINE HCL 60 MG PO TABS: 60.0000 mg | ORAL_TABLET | Freq: Three times a day (TID) | ORAL | 0 refills | Status: DC | PRN

## 2022-08-14 MED ORDER — FEXOFENADINE HCL 180 MG PO TABS: 180.0000 mg | ORAL_TABLET | Freq: Every day | ORAL | 0 refills | Status: DC

## 2022-08-14 NOTE — ED Triage Notes (Signed)
Patient presents to UC for eye swelling and drainage, bilateral ear pain at bedtime, and  clogged nose x 2 weeks. States she is concerned with sinus infection. Taking mucinex since last week.

## 2022-08-14 NOTE — ED Provider Notes (Signed)
[facility]  Note:  This document was prepared using Systems analyst and may include unintentional dictation errors.  MRN: [id number] DOB: 1965-12-17  Subjective:   [name] is a 56 y.o. female presenting for 2-week history of persistent malaise, fatigue, bilateral ear pain, sinus congestion.  She has not had worsening bilateral eyelid pain and swelling.  Has difficulty with allergies and has previously taken Claritin and Zyrtec.  However she has not taken it for this season.  She has been using Mucinex.  No cough, chest pain, shortness of breath or wheezing.  Patient is not a smoker.  No current facility-administered medications for this encounter.  Current Outpatient Medications:    azelastine (OPTIVAR) 0.05 % ophthalmic solution, Place 1 drop into both eyes 2 (two) times daily. As needed for allergy symptoms, Disp: 6 mL, Rfl: 12   cetirizine (ZYRTEC) 10 MG tablet, Take 1 tablet (10 mg total) by mouth daily., Disp: 30 tablet, Rfl: 11   cyclobenzaprine (FLEXERIL) 5 MG tablet, Take 1 tablet (5 mg total) by mouth 3 (three) times daily as needed for muscle spasms. (Patient not taking: Reported on 04/18/2019), Disp: 60 tablet, Rfl: 0   dicyclomine (BENTYL) 20 MG tablet, Take 1 tablet (20 mg total) by mouth 2 (two) times daily., Disp: 20 tablet, Rfl: 0   fluticasone (FLONASE) 50 MCG/ACT nasal spray, Place 1 spray into both nostrils daily., Disp: 16 g, Rfl: 2   lisinopril-hydrochlorothiazide (ZESTORETIC) 20-25 MG tablet, Take 1 tablet by mouth daily., Disp: 30 tablet, Rfl: 0   loratadine (CLARITIN) 10 MG tablet, Take 1 tablet (10 mg total) by mouth daily., Disp: 90 tablet, Rfl: 3   montelukast (SINGULAIR) 10 MG tablet, Take 1 tablet (10 mg total) by mouth at bedtime., Disp: 90 tablet, Rfl: 3   naproxen (NAPROSYN) 500 MG tablet, Take 1 tablet (500 mg total) by mouth 2 (two) times daily with a meal. (Patient not taking: Reported on 04/18/2019), Disp: 30  tablet, Rfl: 0   olopatadine (PATANOL) 0.1 % ophthalmic solution, Place 1 drop into both eyes 2 (two) times daily. (Patient not taking: Reported on 04/18/2019), Disp: 5 mL, Rfl: 5   ondansetron (ZOFRAN ODT) 4 MG disintegrating tablet, Take 1 tablet (4 mg total) by mouth every 8 (eight) hours as needed for nausea or vomiting., Disp: 20 tablet, Rfl: 0   phentermine 30 MG capsule, Take by mouth daily., Disp: , Rfl:    triamcinolone cream (KENALOG) 0.5 %, Apply 1 application topically 2 (two) times daily. (Patient not taking: Reported on 04/18/2019), Disp: 30 g, Rfl: 0   valACYclovir (VALTREX) 500 MG tablet, TAKE 1 TABLET BY MOUTH 2 TIMES DIALY FOR 3 DAYS AS NEEDED OUTBREAK, Disp: 30 tablet, Rfl: 0   Allergies  Allergen Reactions   Bee Pollen Other (See Comments)   Gramineae Pollens Other (See Comments)   Pollen Extract Other (See Comments)   Codeine Nausea And Vomiting and Other (See Comments)    Light headedness    Past Medical History:  Diagnosis Date   Allergy    Hypertension    Seasonal allergies      Past Surgical History:  Procedure Laterality Date   BREAST EXCISIONAL BIOPSY Bilateral 1978   BREAST SURGERY      Family History  Problem Relation Age of Onset   Diabetes Mother    Hypertension Father    Hypertension Sister    Breast cancer Maternal Aunt     Social History  Tobacco Use   Smoking status: Never   Smokeless tobacco: Never  Vaping Use   Vaping Use: Never used  Substance Use Topics   Alcohol use: No    Alcohol/week: 0.0 standard drinks of alcohol   Drug use: No    ROS   Objective:   Vitals: BP 131/86 (BP Location: Right Arm)   Pulse 62   Temp 99 F (37.2 C) (Oral)   Resp 16   LMP 03/01/2015   SpO2 95%   Physical Exam Constitutional:      General: She is not in acute distress.    Appearance: Normal appearance. She is well-developed and normal weight. She is not ill-appearing, toxic-appearing or diaphoretic.  HENT:     Head: Normocephalic  and atraumatic.     Right Ear: Tympanic membrane, ear canal and external ear normal. No drainage or tenderness. No middle ear effusion. There is no impacted cerumen. Tympanic membrane is not erythematous.     Left Ear: Tympanic membrane, ear canal and external ear normal. No drainage or tenderness.  No middle ear effusion. There is no impacted cerumen. Tympanic membrane is not erythematous.     Nose: Nose normal. No congestion or rhinorrhea.     Mouth/Throat:     Mouth: Mucous membranes are moist. No oral lesions.     Pharynx: No pharyngeal swelling, oropharyngeal exudate, posterior oropharyngeal erythema or uvula swelling.     Tonsils: No tonsillar exudate or tonsillar abscesses.  Eyes:     General: No scleral icterus.       Right eye: No discharge.        Left eye: No discharge.     Extraocular Movements: Extraocular movements intact.     Right eye: Normal extraocular motion.     Left eye: Normal extraocular motion.     Conjunctiva/sclera: Conjunctivae normal.     Comments: Trace swelling with slight hyperpigmentation bilateral eyelids both upper and lower.  Cardiovascular:     Rate and Rhythm: Normal rate.  Pulmonary:     Effort: Pulmonary effort is normal.  Musculoskeletal:     Cervical back: Normal range of motion and neck supple.  Lymphadenopathy:     Cervical: No cervical adenopathy.  Skin:    General: Skin is warm and dry.  Neurological:     General: No focal deficit present.     Mental Status: She is alert and oriented to person, place, and time.  Psychiatric:        Mood and Affect: Mood normal.        Behavior: Behavior normal.     Assessment and Plan :   PDMP not reviewed this encounter.  1. Recurrent sinusitis   2. Allergic rhinitis, unspecified seasonality, unspecified trigger     We will treat recurrent sinusitis with Augmentin.  Recommended starting Allegra daily.  Use pseudoephedrine as needed.  Recommended supportive care otherwise. Counseled patient on  potential for adverse effects with medications prescribed/recommended today, ER and return-to-clinic precautions discussed, patient verbalized understanding.    Jaynee Eagles, PA-C 08/14/22 1357

## 2022-11-09 ENCOUNTER — Ambulatory Visit (INDEPENDENT_AMBULATORY_CARE_PROVIDER_SITE_OTHER): Payer: Federal, State, Local not specified - PPO

## 2022-11-09 ENCOUNTER — Ambulatory Visit
Admission: EM | Admit: 2022-11-09 | Discharge: 2022-11-09 | Disposition: A | Discharge status: Home / Self Care | Payer: Federal, State, Local not specified - PPO | Attending: Emergency Medicine | Admitting: Emergency Medicine

## 2022-11-09 DIAGNOSIS — J22 Unspecified acute lower respiratory infection: Secondary | ICD-10-CM | POA: Diagnosis not present

## 2022-11-09 DIAGNOSIS — J3089 Other allergic rhinitis: Secondary | ICD-10-CM

## 2022-11-09 DIAGNOSIS — R059 Cough, unspecified: Secondary | ICD-10-CM | POA: Diagnosis not present

## 2022-11-09 DIAGNOSIS — J302 Other seasonal allergic rhinitis: Secondary | ICD-10-CM | POA: Diagnosis not present

## 2022-11-09 MED ORDER — ALBUTEROL SULFATE (2.5 MG/3ML) 0.083% IN NEBU
2.5000 mg | INHALATION_SOLUTION | Freq: Once | RESPIRATORY_TRACT | Status: AC
  Administered 2022-11-09: 2.5 mg via RESPIRATORY_TRACT

## 2022-11-09 MED ORDER — GUAIFENESIN 400 MG PO TABS: ORAL_TABLET | ORAL | 0 refills | Status: AC

## 2022-11-09 MED ORDER — CEFDINIR 300 MG PO CAPS: 300.0000 mg | ORAL_CAPSULE | Freq: Two times a day (BID) | ORAL | 0 refills | Status: AC

## 2022-11-09 MED ORDER — PROMETHAZINE-DM 6.25-15 MG/5ML PO SYRP: 5.0000 mL | ORAL_SOLUTION | Freq: Every evening | ORAL | 0 refills | Status: DC | PRN

## 2022-11-09 NOTE — ED Triage Notes (Signed)
Pt c/o cough chest congestion that began 11/06/22.   Home interventions: prednisone (from last years prescription), mucinex, Claritin, motrin, OTC nasal spray

## 2022-11-09 NOTE — ED Provider Notes (Addendum)
[facility]    CSN: [id number] Arrival date & time: 11/09/22  0948    HISTORY   Chief Complaint  Patient presents with   Nasal Congestion   Cough   HPI Sharon [name] is a pleasant, 57 y.o. female who presents to urgent care today. Pt c/o cough chest congestion that began 11/06/22.  States she has been taking Mucinex, Claritin, Motrin and using an over-the-counter nasal steroid spray.  Patient states that she also took a couple of tablets of prednisone that she had leftover from a previous prescription, states initially they helped but then her cough got worse.  Patient states she was up all last night coughing, tried to sleep sitting up which was not effective.  Patient states she kept her 59-monthold son last week who was sick with cough and congestion, states he went to the emergency room and was given a nebulizer treatment.  Patient states she is coughed so much that she is also coughing up small streaks of blood.  Patient states it also burns in her chest when she coughs  The history is provided by the patient.   Past Medical History:  Diagnosis Date   Allergy    Hypertension    Seasonal allergies    Patient Active Problem List   Diagnosis Date Noted   Acute pain of right shoulder 02/21/2018   Right shoulder tendinitis 02/21/2018   Musculoskeletal pain 02/21/2018   Muscle spasm 02/21/2018   HSV (herpes simplex virus) infection 01/26/2018   Diverticulosis 06/21/2014   Fibroid, uterine 06/21/2014   GERD 08/06/2009   MORBID OBESITY 05/26/2008   Essential hypertension 02/19/2007   Past Surgical History:  Procedure Laterality Date   BREAST EXCISIONAL BIOPSY Bilateral 1978   BREAST SURGERY     OB History   No obstetric history on file.    Home Medications    Prior to Admission medications   Medication Sig Start Date End Date Taking? Authorizing Provider  amoxicillin-clavulanate (AUGMENTIN) 875-125 MG tablet Take 1 tablet by mouth 2 (two) times daily.  08/14/22   MJaynee Eagles PA-C  azelastine (OPTIVAR) 0.05 % ophthalmic solution Place 1 drop into both eyes 2 (two) times daily. As needed for allergy symptoms 03/21/19   MMaximiano Coss NP  cyclobenzaprine (FLEXERIL) 5 MG tablet Take 1 tablet (5 mg total) by mouth 3 (three) times daily as needed for muscle spasms. Patient not taking: Reported on 04/18/2019 07/18/18   WTenna DelaineD, PA-C  dicyclomine (BENTYL) 20 MG tablet Take 1 tablet (20 mg total) by mouth 2 (two) times daily. 06/20/20   Joy, Shawn C, PA-C  fexofenadine (ALLEGRA) 180 MG tablet Take 1 tablet (180 mg total) by mouth daily. 08/14/22   MJaynee Eagles PA-C  fluticasone (FLONASE) 50 MCG/ACT nasal spray Place 1 spray into both nostrils daily. 10/26/19   Khatri, Hina, PA-C  lisinopril-hydrochlorothiazide (ZESTORETIC) 20-25 MG tablet Take 1 tablet by mouth daily. 03/21/19   MMaximiano Coss NP  montelukast (SINGULAIR) 10 MG tablet Take 1 tablet (10 mg total) by mouth at bedtime. 01/26/18   WGale Journey SDamaris Hippo PA-C  naproxen (NAPROSYN) 500 MG tablet Take 1 tablet (500 mg total) by mouth 2 (two) times daily with a meal. Patient not taking: Reported on 04/18/2019 07/18/18   WTenna DelaineD, PA-C  olopatadine (PATANOL) 0.1 % ophthalmic solution Place 1 drop into both eyes 2 (two) times daily. Patient not taking: Reported on 04/18/2019 02/19/17   EIvar DrapeD, PA  ondansetron (ZOFRAN ODT) 4  MG disintegrating tablet Take 1 tablet (4 mg total) by mouth every 8 (eight) hours as needed for nausea or vomiting. 06/20/20   Joy, Shawn C, PA-C  phentermine 30 MG capsule Take by mouth daily. 02/27/19   [provider]  pseudoephedrine (SUDAFED) 60 MG tablet Take 1 tablet (60 mg total) by mouth every 8 (eight) hours as needed for congestion. 08/14/22   Jaynee Eagles, PA-C  triamcinolone cream (KENALOG) 0.5 % Apply 1 application topically 2 (two) times daily. Patient not taking: Reported on 04/18/2019 03/05/18   Gale Journey, Damaris Hippo, PA-C  valACYclovir  (VALTREX) 500 MG tablet TAKE 1 TABLET BY MOUTH 2 TIMES DIALY FOR 3 DAYS AS NEEDED OUTBREAK 03/21/19   Maximiano Coss, NP    Family History Family History  Problem Relation Age of Onset   Diabetes Mother    Hypertension Father    Hypertension Sister    Breast cancer Maternal Aunt    Social History Social History   Tobacco Use   Smoking status: Never   Smokeless tobacco: Never  Vaping Use   Vaping Use: Never used  Substance Use Topics   Alcohol use: No    Alcohol/week: 0.0 standard drinks of alcohol   Drug use: No   Allergies   Bee pollen, Gramineae pollens, Pollen extract, and Codeine  Review of Systems Review of Systems Pertinent findings revealed after performing a 14 point review of systems has been noted in the history of present illness.  Physical Exam Triage Vital Signs ED Triage Vitals  Enc Vitals Group     BP 09/02/21 0827 (!) 147/82     Pulse Rate 09/02/21 0827 72     Resp 09/02/21 0827 18     Temp 09/02/21 0827 98.3 F (36.8 C)     Temp Source 09/02/21 0827 Oral     SpO2 09/02/21 0827 98 %     Weight --      Height --      Head Circumference --      Peak Flow --      Pain Score 09/02/21 0826 5     Pain Loc --      Pain Edu? --      Excl. in [place]? --   No data found.  Updated Vital Signs BP 116/76 (BP Location: Right Arm)   Pulse 74   Temp 98.2 F (36.8 C) (Oral)   Resp 18   LMP 03/01/2015   SpO2 95%   Physical Exam Vitals and nursing note reviewed.  Constitutional:      General: She is not in acute distress.    Appearance: Normal appearance. She is not ill-appearing.  HENT:     Head: Normocephalic and atraumatic.     Salivary Glands: Right salivary gland is not diffusely enlarged or tender. Left salivary gland is not diffusely enlarged or tender.     Right Ear: Ear canal and external ear normal. No drainage. A middle ear effusion is present. There is no impacted cerumen. Tympanic membrane is bulging. Tympanic membrane is not injected or  erythematous.     Left Ear: Ear canal and external ear normal. No drainage. A middle ear effusion is present. There is no impacted cerumen. Tympanic membrane is bulging. Tympanic membrane is not injected or erythematous.     Ears:     Comments: Bilateral EACs normal, both TMs bulging with clear fluid    Nose: Rhinorrhea present. No nasal deformity, septal deviation, signs of injury, nasal tenderness, mucosal edema or  congestion. Rhinorrhea is clear.     Right Nostril: Occlusion present. No foreign body, epistaxis or septal hematoma.     Left Nostril: Occlusion present. No foreign body, epistaxis or septal hematoma.     Right Turbinates: Enlarged, swollen and pale.     Left Turbinates: Enlarged, swollen and pale.     Right Sinus: No maxillary sinus tenderness or frontal sinus tenderness.     Left Sinus: No maxillary sinus tenderness or frontal sinus tenderness.     Mouth/Throat:     Lips: Pink. No lesions.     Mouth: Mucous membranes are moist. No oral lesions.     Pharynx: Oropharynx is clear. Uvula midline. No posterior oropharyngeal erythema or uvula swelling.     Tonsils: No tonsillar exudate. 0 on the right. 0 on the left.     Comments: Postnasal drip Eyes:     General: Lids are normal.        Right eye: No discharge.        Left eye: No discharge.     Extraocular Movements: Extraocular movements intact.     Conjunctiva/sclera: Conjunctivae normal.     Right eye: Right conjunctiva is not injected.     Left eye: Left conjunctiva is not injected.  Neck:     Trachea: Trachea and phonation normal.  Cardiovascular:     Rate and Rhythm: Normal rate and regular rhythm.     Pulses: Normal pulses.     Heart sounds: Normal heart sounds. No murmur heard.    No friction rub. No gallop.  Pulmonary:     Effort: Pulmonary effort is normal. No accessory muscle usage, prolonged expiration or respiratory distress.     Breath sounds: No stridor, decreased air movement or transmitted upper airway  sounds. Examination of the right-upper field reveals decreased breath sounds. Examination of the left-upper field reveals decreased breath sounds. Examination of the right-middle field reveals decreased breath sounds. Examination of the left-middle field reveals decreased breath sounds. Examination of the right-lower field reveals decreased breath sounds. Examination of the left-lower field reveals decreased breath sounds. Decreased breath sounds present. No wheezing, rhonchi or rales.  Chest:     Chest wall: No tenderness.  Musculoskeletal:        General: Normal range of motion.     Cervical back: Normal range of motion and neck supple. Normal range of motion.  Lymphadenopathy:     Cervical: No cervical adenopathy.  Skin:    General: Skin is warm and dry.     Findings: No erythema or rash.  Neurological:     General: No focal deficit present.     Mental Status: She is alert and oriented to person, place, and time.  Psychiatric:        Mood and Affect: Mood normal.        Behavior: Behavior normal.     Visual Acuity Right Eye Distance:   Left Eye Distance:   Bilateral Distance:    Right Eye Near:   Left Eye Near:    Bilateral Near:     [facility] Couse / Diagnostics / Procedures:     Radiology DG Chest 2 View  Result Date: 11/09/2022 CLINICAL DATA:  Cough. EXAM: CHEST - 2 VIEW COMPARISON:  July 28, 2009 FINDINGS: The heart size and mediastinal contours are within normal limits. Both lungs are clear. The visualized skeletal structures are unremarkable. IMPRESSION: No active cardiopulmonary disease. Electronically Signed   By: Marijo Conception M.D.   On:  11/09/2022 12:41    Procedures Procedures (including critical care time) EKG  Pending results:  Labs Reviewed - No data to display  Medications Ordered in UC: Medications  albuterol (PROVENTIL) (2.5 MG/3ML) 0.083% nebulizer solution 2.5 mg (2.5 mg Nebulization Given 11/09/22 1202)    UC Diagnoses / Final Clinical  Impressions(s)   I have reviewed the triage vital signs and the nursing notes.  Pertinent labs & imaging results that were available during my care of the patient were reviewed by me and considered in my medical decision making (see chart for details).    Final diagnoses:  Lower respiratory tract infection  Perennial allergic rhinitis with seasonal variation   Per my personal read of her chest x-ray, I am concerned that she may be developing pneumonia particularly in light of the 10 sputum.  Patient provided with a 7-day course of Omnicef that she can discontinue after 5 days if feeling better.  Supportive medications advised.  Conservative care recommended.  Return precautions advised. Please see discharge instructions below for further details of plan of care as provided to patient. ED Prescriptions     Medication Sig Dispense Auth. Provider   cefdinir (OMNICEF) 300 MG capsule Take 1 capsule (300 mg total) by mouth 2 (two) times daily for 7 days. 14 capsule Lynden Oxford Scales, PA-C   guaifenesin (HUMIBID E) 400 MG TABS tablet Take 1 tablet 3 times daily as needed for chest congestion and cough 21 tablet Lynden Oxford Scales, PA-C   promethazine-dextromethorphan (PROMETHAZINE-DM) 6.25-15 MG/5ML syrup Take 5 mLs by mouth at bedtime as needed for cough. 60 mL Lynden Oxford Scales, PA-C      PDMP not reviewed this encounter.  Disposition Upon Discharge:  Condition: stable for discharge home Home: take medications as prescribed; routine discharge instructions as discussed; follow up as advised.  Patient presented with an acute illness with associated systemic symptoms and significant discomfort requiring urgent management. In my opinion, this is a condition that a prudent lay person (someone who possesses an average knowledge of health and medicine) may potentially expect to result in complications if not addressed urgently such as respiratory distress, impairment of bodily function or  dysfunction of bodily organs.   Routine symptom specific, illness specific and/or disease specific instructions were discussed with the patient and/or caregiver at length.   As such, the patient has been evaluated and assessed, work-up was performed and treatment was provided in alignment with urgent care protocols and evidence based medicine.  Patient/parent/caregiver has been advised that the patient may require follow up for further testing and treatment if the symptoms continue in spite of treatment, as clinically indicated and appropriate.  If the patient was tested for COVID-19, Influenza and/or RSV, then the patient/parent/guardian was advised to isolate at home pending the results of his/her diagnostic coronavirus test and potentially longer if they're positive. I have also advised pt that if his/her COVID-19 test returns positive, it's recommended to self-isolate for at least 10 days after symptoms first appeared AND until fever-free for 24 hours without fever reducer AND other symptoms have improved or resolved. Discussed self-isolation recommendations as well as instructions for household member/close contacts as per the [facility] and [place] [facility], and also gave patient the [place] packet with this information.  Patient/parent/caregiver has been advised to return to the [facility] or PCP in 3-5 days if no better; to PCP or the Emergency Department if new signs and symptoms develop, or if the current signs or symptoms continue to change or worsen  for further workup, evaluation and treatment as clinically indicated and appropriate  The patient will follow up with their current PCP if and as advised. If the patient does not currently have a PCP we will assist them in obtaining one.   The patient may need specialty follow up if the symptoms continue, in spite of conservative treatment and management, for further workup, evaluation, consultation and treatment as clinically indicated and  appropriate.  Patient/parent/caregiver verbalized understanding and agreement of plan as discussed.  All questions were addressed during visit.  Please see discharge instructions below for further details of plan.  Discharge Instructions:   Discharge Instructions      Per my personal read of your chest x-ray and because she did not have significant improvement of your breath sounds after your albuterol treatment, I believe that we should treat you empirically for presumed pneumonia.    I have sent a prescription for an antibiotic called cefdinir to your pharmacy, please take 1 tablet twice daily for the next 5 days.  Recommend the following medications to help relieve your symptoms:  Zyrtec (cetirizine): This is an excellent second-generation antihistamine that helps to reduce respiratory inflammatory response to environmental allergens.  In some patients, this medication can cause daytime sleepiness so I recommend that you take 1 tablet daily at bedtime.     Flonase (fluticasone): This is a steroid nasal spray that you use once daily, 1 spray in each nare.  This medication does not work well if you decide to use it only used as you feel you need to, it works best used on a daily basis.  After 3 to 5 days of use, you will notice significant reduction of the inflammation and mucus production that is currently being caused by exposure to allergens, whether seasonal or environmental.  The most common side effect of this medication is nosebleeds.  If you experience a nosebleed, please discontinue use for 1 week, then feel free to resume.      ProAir, Ventolin, Proventil (albuterol): This inhaled medication contains a short acting beta agonist bronchodilator.  This medication works on the smooth muscle that opens and constricts of your airways by relaxing the muscle.  The result of relaxation of the smooth muscle is increased air movement and improved work of breathing.  This is a short acting  medication that can be used every 4-6 hours as needed for increased work of breathing, shortness of breath, wheezing and excessive coughing.      Advil, Motrin (ibuprofen): This is a good anti-inflammatory medication which not only addresses aches, pains but also significantly reduces soft tissue inflammation of the upper airways that causes sinus and nasal congestion as well as inflammation of the lower airways which makes you feel like your breathing is constricted or your cough feel tight.  I recommend that you take 400 mg every 8 hours as needed.       Robitussin, Mucinex (guaifenesin): This is an expectorant.  This helps break up chest congestion and loosen up thick nasal drainage making phlegm and drainage more liquid and therefore easier to remove.  I recommend being 400 mg three times daily as needed.      Promethazine DM: Promethazine is both a nasal decongestant and an antinausea medication that makes most patients feel fairly sleepy.  The DM is dextromethorphan, a cough suppressant found in many over-the-counter cough medications.  Please take 5 mL before bedtime to minimize your cough which will help you sleep better.  I have  sent a prescription for this medication to your pharmacy.   Please follow-up within the next 5-7 days either with your primary care provider or urgent care if your symptoms do not resolve.  If you do not have a primary care provider, we will assist you in finding one.        Thank you for visiting urgent care today.  We appreciate the opportunity to participate in your care.       This office note has been dictated using Museum/gallery curator.  Unfortunately, this method of dictation can sometimes lead to typographical or grammatical errors.  I apologize for your inconvenience in advance if this occurs.  Please do not hesitate to reach out to me if clarification is needed.      Lynden Oxford Scales, PA-C 11/09/22 1240    Lynden Oxford [place],  [place] 11/09/22 1247

## 2022-11-09 NOTE — Discharge Instructions (Addendum)
Per my personal read of your chest x-ray and because she did not have significant improvement of your breath sounds after your albuterol treatment, I believe that we should treat you empirically for presumed pneumonia.    I have sent a prescription for an antibiotic called cefdinir to your pharmacy, please take 1 tablet twice daily for the next 5 days.  Recommend the following medications to help relieve your symptoms:  Zyrtec (cetirizine): This is an excellent second-generation antihistamine that helps to reduce respiratory inflammatory response to environmental allergens.  In some patients, this medication can cause daytime sleepiness so I recommend that you take 1 tablet daily at bedtime.     Flonase (fluticasone): This is a steroid nasal spray that you use once daily, 1 spray in each nare.  This medication does not work well if you decide to use it only used as you feel you need to, it works best used on a daily basis.  After 3 to 5 days of use, you will notice significant reduction of the inflammation and mucus production that is currently being caused by exposure to allergens, whether seasonal or environmental.  The most common side effect of this medication is nosebleeds.  If you experience a nosebleed, please discontinue use for 1 week, then feel free to resume.      ProAir, Ventolin, Proventil (albuterol): This inhaled medication contains a short acting beta agonist bronchodilator.  This medication works on the smooth muscle that opens and constricts of your airways by relaxing the muscle.  The result of relaxation of the smooth muscle is increased air movement and improved work of breathing.  This is a short acting medication that can be used every 4-6 hours as needed for increased work of breathing, shortness of breath, wheezing and excessive coughing.      Advil, Motrin (ibuprofen): This is a good anti-inflammatory medication which not only addresses aches, pains but also significantly  reduces soft tissue inflammation of the upper airways that causes sinus and nasal congestion as well as inflammation of the lower airways which makes you feel like your breathing is constricted or your cough feel tight.  I recommend that you take 400 mg every 8 hours as needed.       Robitussin, Mucinex (guaifenesin): This is an expectorant.  This helps break up chest congestion and loosen up thick nasal drainage making phlegm and drainage more liquid and therefore easier to remove.  I recommend being 400 mg three times daily as needed.      Promethazine DM: Promethazine is both a nasal decongestant and an antinausea medication that makes most patients feel fairly sleepy.  The DM is dextromethorphan, a cough suppressant found in many over-the-counter cough medications.  Please take 5 mL before bedtime to minimize your cough which will help you sleep better.  I have sent a prescription for this medication to your pharmacy.   Please follow-up within the next 5-7 days either with your primary care provider or urgent care if your symptoms do not resolve.  If you do not have a primary care provider, we will assist you in finding one.        Thank you for visiting urgent care today.  We appreciate the opportunity to participate in your care.

## 2022-11-14 ENCOUNTER — Telehealth: Payer: Self-pay

## 2022-11-14 MED ORDER — ALBUTEROL SULFATE HFA 108 (90 BASE) MCG/ACT IN AERS: 2.0000 | INHALATION_SPRAY | Freq: Four times a day (QID) | RESPIRATORY_TRACT | 0 refills | Status: AC | PRN

## 2022-11-14 NOTE — Telephone Encounter (Signed)
Patient verification complete (name and date of birth).   Pt calling about albuterol inhaler prescription. Provider made aware.

## 2023-03-26 ENCOUNTER — Ambulatory Visit
Admission: EM | Admit: 2023-03-26 | Discharge: 2023-03-26 | Disposition: A | Discharge status: Home / Self Care | Payer: Federal, State, Local not specified - PPO

## 2023-03-26 DIAGNOSIS — T8149XA Infection following a procedure, other surgical site, initial encounter: Secondary | ICD-10-CM

## 2023-03-26 DIAGNOSIS — J069 Acute upper respiratory infection, unspecified: Secondary | ICD-10-CM

## 2023-03-26 DIAGNOSIS — Z9889 Other specified postprocedural states: Secondary | ICD-10-CM

## 2023-03-26 DIAGNOSIS — Z4889 Encounter for other specified surgical aftercare: Secondary | ICD-10-CM

## 2023-03-26 LAB — POCT RAPID STREP A (OFFICE): Rapid Strep A Screen: NEGATIVE

## 2023-03-26 MED ORDER — PROMETHAZINE-DM 6.25-15 MG/5ML PO SYRP: 2.5000 mL | ORAL_SOLUTION | Freq: Three times a day (TID) | ORAL | 0 refills | Status: AC | PRN

## 2023-03-26 MED ORDER — CETIRIZINE HCL 10 MG PO TABS: 10.0000 mg | ORAL_TABLET | Freq: Every day | ORAL | 0 refills | Status: AC

## 2023-03-26 MED ORDER — PSEUDOEPHEDRINE HCL 60 MG PO TABS: 60.0000 mg | ORAL_TABLET | Freq: Three times a day (TID) | ORAL | 0 refills | Status: AC | PRN

## 2023-03-26 MED ORDER — CEPHALEXIN 500 MG PO CAPS: 500.0000 mg | ORAL_CAPSULE | Freq: Three times a day (TID) | ORAL | 0 refills | Status: DC

## 2023-03-26 NOTE — ED Triage Notes (Signed)
Pt she had tummy tuck on 02/27/23 and the wound looks darker.   Cough x 2 days when sleeping.

## 2023-03-26 NOTE — ED Provider Notes (Signed)
[facility]  Note:  This document was prepared using Conservation officer, historic buildings and may include unintentional dictation errors.  MRN: [id number] DOB: 1966-05-11  Subjective:   Sharon [name] is a 57 y.o. female presenting for 2 concerns.  Would like a wound check.  Had an abdominoplasty in [place] 02/27/2023.  Has followed wound care instructions.  Is concerned about infection of the site if she is having drainage and still has pain.  She is trying to do dressing changes at home.  Cannot follow-up with her general surgeon.  Was advised to have someone take over her wound care here in [place]. Has had left-sided throat pain, tonsil swelling, tickle in her throat that makes her cough.  No fever, runny or stuffy nose, chest pain, shortness of breath or wheezing.  She does have a history of allergies but is not taking anything consistently for this.  No current facility-administered medications for this encounter.  Current Outpatient Medications:    albuterol (VENTOLIN HFA) 108 (90 Base) MCG/ACT inhaler, Inhale 2 puffs into the lungs every 6 (six) hours as needed for wheezing or shortness of breath (Cough)., Disp: 18 g, Rfl: 0   azelastine (OPTIVAR) 0.05 % ophthalmic solution, Place 1 drop into both eyes 2 (two) times daily. As needed for allergy symptoms, Disp: 6 mL, Rfl: 12   dicyclomine (BENTYL) 20 MG tablet, Take 1 tablet (20 mg total) by mouth 2 (two) times daily., Disp: 20 tablet, Rfl: 0   fexofenadine (ALLEGRA) 180 MG tablet, Take 1 tablet (180 mg total) by mouth daily., Disp: 90 tablet, Rfl: 0   fluticasone (FLONASE) 50 MCG/ACT nasal spray, Place 1 spray into both nostrils daily., Disp: 16 g, Rfl: 2   guaifenesin (HUMIBID E) 400 MG TABS tablet, Take 1 tablet 3 times daily as needed for chest congestion and cough, Disp: 21 tablet, Rfl: 0   lisinopril-hydrochlorothiazide (ZESTORETIC) 20-25 MG tablet, Take 1 tablet by mouth daily., Disp: 30 tablet,  Rfl: 0   montelukast (SINGULAIR) 10 MG tablet, Take 1 tablet (10 mg total) by mouth at bedtime., Disp: 90 tablet, Rfl: 3   ondansetron (ZOFRAN ODT) 4 MG disintegrating tablet, Take 1 tablet (4 mg total) by mouth every 8 (eight) hours as needed for nausea or vomiting., Disp: 20 tablet, Rfl: 0   phentermine 30 MG capsule, Take by mouth daily., Disp: , Rfl:    promethazine-dextromethorphan (PROMETHAZINE-DM) 6.25-15 MG/5ML syrup, Take 5 mLs by mouth at bedtime as needed for cough., Disp: 60 mL, Rfl: 0   pseudoephedrine (SUDAFED) 60 MG tablet, Take 1 tablet (60 mg total) by mouth every 8 (eight) hours as needed for congestion., Disp: 30 tablet, Rfl: 0   triamcinolone cream (KENALOG) 0.5 %, Apply 1 application topically 2 (two) times daily. (Patient not taking: Reported on 04/18/2019), Disp: 30 g, Rfl: 0   valACYclovir (VALTREX) 500 MG tablet, TAKE 1 TABLET BY MOUTH 2 TIMES DIALY FOR 3 DAYS AS NEEDED OUTBREAK, Disp: 30 tablet, Rfl: 0   Allergies  Allergen Reactions   Bee Pollen Other (See Comments)   Gramineae Pollens Other (See Comments)   Pollen Extract Other (See Comments)   Codeine Nausea And Vomiting and Other (See Comments)    Light headedness    Past Medical History:  Diagnosis Date   Allergy    Hypertension    Seasonal allergies      Past Surgical History:  Procedure Laterality Date   BREAST EXCISIONAL BIOPSY Bilateral 1978  BREAST SURGERY      Family History  Problem Relation Age of Onset   Diabetes Mother    Hypertension Father    Hypertension Sister    Breast cancer Maternal Aunt     Social History   Tobacco Use   Smoking status: Never   Smokeless tobacco: Never  Vaping Use   Vaping Use: Never used  Substance Use Topics   Alcohol use: No    Alcohol/week: 0.0 standard drinks of alcohol   Drug use: No    ROS   Objective:   Vitals: BP 131/87 (BP Location: Right Arm)   Pulse 60   Temp (!) 97.5 F (36.4 C) (Oral)   Resp 16   LMP 03/01/2015   SpO2 98%    Physical Exam Constitutional:      General: She is not in acute distress.    Appearance: Normal appearance. She is well-developed. She is not ill-appearing, toxic-appearing or diaphoretic.  HENT:     Head: Normocephalic and atraumatic.     Nose: Nose normal.     Mouth/Throat:     Mouth: Mucous membranes are moist.     Pharynx: No pharyngeal swelling, oropharyngeal exudate, posterior oropharyngeal erythema or uvula swelling.     Tonsils: No tonsillar exudate or tonsillar abscesses. 0 on the right. 0 on the left.  Eyes:     General: No scleral icterus.       Right eye: No discharge.        Left eye: No discharge.     Extraocular Movements: Extraocular movements intact.  Cardiovascular:     Rate and Rhythm: Normal rate and regular rhythm.     Heart sounds: Normal heart sounds. No murmur heard.    No friction rub. No gallop.  Pulmonary:     Effort: Pulmonary effort is normal. No respiratory distress.     Breath sounds: No stridor. No wheezing, rhonchi or rales.  Chest:     Chest wall: No tenderness.  Skin:    General: Skin is warm and dry.       Neurological:     General: No focal deficit present.     Mental Status: She is alert and oriented to person, place, and time.  Psychiatric:        Mood and Affect: Mood normal.        Behavior: Behavior normal.     Results for orders placed or performed during the hospital encounter of 03/26/23 (from the past 24 hour(s))  POCT rapid strep A     Status: None   Collection Time: 03/26/23  1:16 PM  Result Value Ref Range   Rapid Strep A Screen Negative Negative    Assessment and Plan :   PDMP not reviewed this encounter.  1. Viral upper respiratory illness   2. Encounter for post surgical wound check   3. Postoperative wound infection   4. History of abdominoplasty    Suspect viral URI, viral syndrome. Physical exam findings reassuring and vital signs stable for discharge.  She does have signs of a wound infection and  therefore recommended Keflex.  Placed a referral to the wound care clinic.  Advised supportive care, offered symptomatic relief. Counseled patient on potential for adverse effects with medications prescribed/recommended today, ER and return-to-clinic precautions discussed, patient verbalized understanding.     Wallis Bamberg, PA-C 03/26/23 1427

## 2023-04-12 ENCOUNTER — Encounter (HOSPITAL_BASED_OUTPATIENT_CLINIC_OR_DEPARTMENT_OTHER): Payer: Federal, State, Local not specified - PPO | Attending: Internal Medicine | Admitting: Internal Medicine

## 2023-04-12 DIAGNOSIS — E11622 Type 2 diabetes mellitus with other skin ulcer: Secondary | ICD-10-CM | POA: Insufficient documentation

## 2023-04-12 DIAGNOSIS — E1151 Type 2 diabetes mellitus with diabetic peripheral angiopathy without gangrene: Secondary | ICD-10-CM | POA: Diagnosis present

## 2023-04-12 DIAGNOSIS — I10 Essential (primary) hypertension: Secondary | ICD-10-CM | POA: Diagnosis not present

## 2023-04-12 DIAGNOSIS — L98499 Non-pressure chronic ulcer of skin of other sites with unspecified severity: Secondary | ICD-10-CM | POA: Insufficient documentation

## 2023-04-13 NOTE — Progress Notes (Signed)
Sharon [name], Sharon [name] ([id number]) [id number] Page 1 of 4 Visit Report for 04/12/2023 Abuse Risk Screen Details Patient Name: Date of Service: Sharon [name], Sharon NIA [name]. 04/12/2023 9:30 [name] Medical Record Number: [id number] Patient Account Number: [id number] Date of Birth/Sex: Treating RN: 1965-11-11 (57 y.o. Katrinka Blazing Primary Care Rosine Solecki: Benny Lennert Other Clinician: Referring Lamontae Ricardo: Treating Kadin Canipe/Extender: Alma Downs in Treatment: 0 Abuse Risk Screen Items Answer ABUSE RISK SCREEN: Has anyone close to you tried to hurt or harm you recentlyo No Do you feel uncomfortable with anyone in your familyo No Has anyone forced you do things that you didnt want to doo No Electronic Signature(s) Signed: 04/12/2023 6:20:57 PM By: Karie Schwalbe RN Entered By: Karie Schwalbe on 04/12/2023 10:35:57 -------------------------------------------------------------------------------- Activities of Daily Living Details Patient Name: Date of Service: Sharon [name], Sharon NIA [name]. 04/12/2023 9:30 [name] Medical Record Number: [id number] Patient Account Number: [id number] Date of Birth/Sex: Treating RN: 1966/06/05 (57 y.o. Katrinka Blazing Primary Care Corian Handley: Benny Lennert Other Clinician: Referring Liahm Grivas: Treating Tyquarius Paglia/Extender: Alma Downs in Treatment: 0 Activities of Daily Living Items Answer Activities of Daily Living (Please select one for each item) Drive Automobile Completely Able T Medications ake Completely Able Use T elephone Completely Able Care for Appearance Completely Able Use T oilet Completely Able Bath / Shower Completely Able Dress Self Completely Able Feed Self Completely Able Walk Completely Able Get In / Out Bed Completely Able Housework Completely Able Prepare Meals Completely Able Handle Money Completely Able Shop for Self Completely Able Electronic Signature(s) Signed: 04/12/2023 6:20:57 PM By:  Karie Schwalbe RN Entered By: Karie Schwalbe on 04/12/2023 10:36:35 -------------------------------------------------------------------------------- Education Screening Details Patient Name: Date of Service: Sharon [name], Sharon NIA [name]. 04/12/2023 9:30 [name] Medical Record Number: [id number] Patient Account Number: [id number] Date of Birth/Sex: Treating RN: [birth year] (57 y.o. Katrinka Blazing Primary Care Verne Lanuza: Benny Lennert Other Clinician: Referring Denim Start: Treating Jameshia Hayashida/Extender: Alma Downs in Treatment: 0 Sharon [name], Sharon [name] ([id number]) [id number] Page 2 of 4 Primary Learner Assessed: Patient Learning Preferences/Education Level/Primary Language Learning Preference: Explanation, Demonstration Highest Education Level: College or Above Preferred Language: English Cognitive Barrier Language Barrier: No Translator Needed: No Memory Deficit: No Emotional Barrier: No Cultural/Religious Beliefs Affecting Medical Care: No Physical Barrier Impaired Vision: No Impaired Hearing: No Decreased Hand dexterity: No Knowledge/Comprehension Knowledge Level: High Comprehension Level: High Ability to understand written instructions: High Ability to understand verbal instructions: High Motivation Anxiety Level: Calm Cooperation: Cooperative Education Importance: Acknowledges Need Interest in Health Problems: Asks Questions Perception: Coherent Willingness to Engage in Self-Management High Activities: Readiness to Engage in Self-Management High Activities: Electronic Signature(s) Signed: 04/12/2023 6:20:57 PM By: Karie Schwalbe RN Entered By: Karie Schwalbe on 04/12/2023 10:39:03 -------------------------------------------------------------------------------- Fall Risk Assessment Details Patient Name: Date of Service: Sharon [name], Sharon NIA [name]. 04/12/2023 9:30 [name] Medical Record Number: [id number] Patient Account Number: [id number] Date of  Birth/Sex: Treating RN: [birth year] (57 y.o. Katrinka Blazing Primary Care Ranson Belluomini: Benny Lennert Other Clinician: Referring Eugenie Harewood: Treating Remon Quinto/Extender: Alma Downs in Treatment: 0 Fall Risk Assessment Items Have you had 2 or more falls in the last 12 monthso 0 No Have you had any fall that resulted in injury in the last 12 monthso 0 No FALLS RISK SCREEN History of falling - immediate or within 3 months 0 No Secondary diagnosis (Do you have 2 or more medical diagnoseso) 0 No Ambulatory aid None/bed rest/wheelchair/nurse 0 No Crutches/cane/walker 0 No  Furniture 0 No Intravenous therapy Access/Saline/Heparin Lock 0 No Gait/Transferring Normal/ bed rest/ wheelchair 0 No Weak (short steps with or without shuffle, stooped but able to lift head while walking, may seek 0 No support from furniture) Impaired (short steps with shuffle, may have difficulty arising from chair, head down, impaired 0 No balance) Mental Status Oriented to own ability 0 No Overestimates or forgets limitations 0 No Risk Level: Low Risk Score: 0 Sharon [name], Sharon [name] ([id number]) [id number] Page 3 of 4 Electronic Signature(s) -------------------------------------------------------------------------------- Foot Assessment Details Patient Name: Date of Service: Sharon [name], Sharon NIA [name]. 04/12/2023 9:30 [name] Medical Record Number: [id number] Patient Account Number: [id number] Date of Birth/Sex: Treating RN: May 24, 1966 (57 y.o. Katrinka Blazing Primary Care Mabell Esguerra: Benny Lennert Other Clinician: Referring Usher Hedberg: Treating Ingri Diemer/Extender: Alma Downs in Treatment: 0 Foot Assessment Items Site Locations + = Sensation present, - = Sensation absent, C = Callus, U = Ulcer R = Redness, W = Warmth, M = Maceration, PU = Pre-ulcerative lesion F = Fissure, S = Swelling, D = Dryness Assessment Right: Left: Other Deformity: No No Prior Foot  Ulcer: No No Prior Amputation: No No Charcot Joint: No No Ambulatory Status: Ambulatory Without Help Gait: Steady Electronic Signature(s) Signed: 04/12/2023 6:20:57 PM By: Karie Schwalbe RN Entered By: Karie Schwalbe on 04/12/2023 10:41:33 -------------------------------------------------------------------------------- Nutrition Risk Screening Details Patient Name: Date of Service: Sharon [name], Sharon NIA [name]. 04/12/2023 9:30 [name] Medical Record Number: [id number] Patient Account Number: [id number] Date of Birth/Sex: Treating RN: 14-Jun-1966 (57 y.o. Katrinka Blazing Primary Care Ravis Herne: Benny Lennert Other Clinician: Referring Eason Housman: Treating Latesha Chesney/Extender: Alma Downs in Treatment: 0 Height (in): 66 Weight (lbs): 210 Body Mass Index (BMI): 33.9 Sharon [name], Sharon [name] ([id number]) [phone] [id number] Page 4 of 4 Nutrition Risk Screening Items Score Screening NUTRITION RISK SCREEN: I have an illness or condition that made me change the kind and/or amount of food I eat 0 No I eat fewer than two meals per day 0 No I eat few fruits and vegetables, or milk products 0 No I have three or more drinks of beer, liquor or wine almost every day 0 No I have tooth or mouth problems that make it hard for me to eat 0 No I don't always have enough money to buy the food I need 0 No I eat alone most of the time 0 No I take three or more different prescribed or over-the-counter drugs [name] day 0 No Without wanting to, I have lost or gained 10 pounds in the last six months 0 No I am not always physically able to shop, cook and/or feed myself 0 No Nutrition Protocols Good Risk Protocol 0 No interventions needed Moderate Risk Protocol High Risk Proctocol Risk Level: Good Risk Score: 0 Electronic Signature(s) Signed: 04/12/2023 6:20:57 PM By: Karie Schwalbe RN Entered By: Karie Schwalbe on 04/12/2023 10:39:28

## 2023-04-26 ENCOUNTER — Encounter (HOSPITAL_BASED_OUTPATIENT_CLINIC_OR_DEPARTMENT_OTHER): Payer: Federal, State, Local not specified - PPO | Admitting: Internal Medicine

## 2023-04-26 DIAGNOSIS — S31109S Unspecified open wound of abdominal wall, unspecified quadrant without penetration into peritoneal cavity, sequela: Secondary | ICD-10-CM | POA: Diagnosis not present

## 2023-04-26 DIAGNOSIS — E11622 Type 2 diabetes mellitus with other skin ulcer: Secondary | ICD-10-CM

## 2023-04-26 DIAGNOSIS — T8131XD Disruption of external operation (surgical) wound, not elsewhere classified, subsequent encounter: Secondary | ICD-10-CM | POA: Diagnosis not present

## 2023-04-26 NOTE — Progress Notes (Signed)
LEWANNA, MEDEARIS A ([id number]) [id number] Page 1 of 7 Visit Report for 04/26/2023 Chief Complaint Document Details Patient Name: Date of Service: Sharon [name], Sharon NIA A. 04/26/2023 9:15 A M Medical Record Number: [id number] Patient Account Number: [id number] Date of Birth/Sex: Treating RN: 1966/04/22 (57 y.o. F) Primary Care Provider: Benny Lennert Other Clinician: Referring Provider: Treating Provider/Extender: Skeet Latch in Treatment: 2 Information Obtained from: Patient Chief Complaint 04/12/2023; patient is here for review of nonhealing wounds and an abdominoplasty incision on April 23/24 Electronic Signature(s) Signed: 04/26/2023 4:07:01 PM By: Geralyn Corwin DO Entered By: Geralyn Corwin on 04/26/2023 10:20:54 -------------------------------------------------------------------------------- HPI Details Patient Name: Date of Service: Sharon [name], Sharon NIA A. 04/26/2023 9:15 A M Medical Record Number: [id number] Patient Account Number: [id number] Date of Birth/Sex: Treating RN: 15-Oct-1966 (57 y.o. F) Primary Care Provider: Benny Lennert Other Clinician: Referring Provider: Treating Provider/Extender: Skeet Latch in Treatment: 2 History of Present Illness HPI Description: ADMISSION 04/12/2023 This is a 57 year old woman with type 2 diabetes who underwent an abdominoplasty on April 23/2024. She states that the wound was closed but dehisced in 3 areas perhaps 2 to 3 weeks after the surgery. She has been using ABD pads and abdominal binder. Her surgery was by Dr. Marya Amsler plastic surgery in [place]. Past medical history type 2 diabetes, hypertension 6/20; patient presents for follow-up. She has been using collagen to the wound beds without issues. The left side wound has healed. She has 2 small remaining wounds 1 to the abdomen and another to the right side. She has no issues or complaints today. Electronic Signature(s) Signed:  04/26/2023 4:07:01 PM By: Geralyn Corwin DO Entered By: Geralyn Corwin on 04/26/2023 10:21:23 -------------------------------------------------------------------------------- Physical Exam Details Patient Name: Date of Service: Sharon [name], Sharon NIA A. 04/26/2023 9:15 A PETRINA, ALLSTON A ([id number]) [id number] Page 2 of 7 Medical Record Number: [id number] Patient Account Number: [id number] Date of Birth/Sex: Treating RN: [birth year] (57 y.o. F) Primary Care Provider: Benny Lennert Other Clinician: Referring Provider: Treating Provider/Extender: Skeet Latch in Treatment: 2 Constitutional respirations regular, non-labored and within target range for patient.. Cardiovascular 2+ dorsalis pedis/posterior tibialis pulses. Psychiatric pleasant and cooperative. Notes 2 small open wounds with granulation tissue at the opening 1 located to the abdomen and the other on the right side. Mostly epithelization to the incision site. No signs of infection. No drainage noted. Electronic Signature(s) Signed: 04/26/2023 4:07:01 PM By: Geralyn Corwin DO Entered By: Geralyn Corwin on 04/26/2023 10:25:37 -------------------------------------------------------------------------------- Physician Orders Details Patient Name: Date of Service: Sharon [name], Sharon NIA A. 04/26/2023 9:15 A M Medical Record Number: [id number] Patient Account Number: [id number] Date of Birth/Sex: Treating RN: [birth year] (57 y.o. Debara Pickett, Millard.Loa Primary Care Provider: Benny Lennert Other Clinician: Referring Provider: Treating Provider/Extender: Skeet Latch in Treatment: 2 Verbal / Phone Orders: No Diagnosis Coding ICD-10 Coding Code Description T81.31XD Disruption of external operation (surgical) wound, not elsewhere classified, subsequent encounter S31.109S Unspecified open wound of abdominal wall, unspecified quadrant without penetration into peritoneal cavity,  sequela E11.622 Type 2 diabetes mellitus with other skin ulcer Follow-up Appointments ppointment in 1 week. - 130pm Thursday 05/03/2023 room 7 Return A Other: - Patient to wait one more week to be out of work. Will reevaluate next week when to return to work. Anesthetic (In clinic) Topical Lidocaine 4% applied to wound bed - Used in clinic Bathing/ Shower/ Hygiene May shower and wash wound with soap and water. - Change dressing after showering/bathing, if  Sharon desired. Additional Orders / Instructions Other: - Please do not have waistband of pants/underpants over the abdomen surgical site. Continue to wear abdominal binder. Wound Treatment Wound #1 - Abdomen - Lower Quadrant Wound Laterality: Right Prim Dressing: Promogran Prisma Matrix, 4.34 (sq in) (silver collagen) (Generic) 1 x Per Day/30 Days ary Discharge Instructions: Moisten collagen with saline or hydrogel Secondary Dressing: ABD Pad, 8x10 (Generic) 1 x Per Day/30 Days Discharge Instructions: Apply over primary dressing as directed. Secondary Dressing: Bordered Gauze, 2x2 in (Generic) 1 x Per Day/30 Days Discharge Instructions: Apply over primary dressing as directed. JAIMYA, WANKO A ([id number]) [id number] Page 3 of 7 Add-Ons: Skintegrity Hydrogel (Generic) 1 x Per Day/30 Days Wound #2 - Abdomen - midline Wound Laterality: Midline Prim Dressing: Promogran Prisma Matrix, 4.34 (sq in) (silver collagen) (Generic) 1 x Per Day/30 Days ary Discharge Instructions: Moisten collagen with saline or hydrogel Secondary Dressing: ABD Pad, 8x10 (Generic) 1 x Per Day/30 Days Discharge Instructions: Apply over primary dressing as directed. Secondary Dressing: Bordered Gauze, 2x3.75 in (Generic) 1 x Per Day/30 Days Discharge Instructions: Apply over primary dressing as directed. Secondary Dressing: 1 x Per Day/30 Days Add-Ons: Skintegrity Hydrogel (Generic) 1 x Per Day/30 Days Electronic Signature(s) Signed:  04/26/2023 4:07:01 PM By: Geralyn Corwin DO Entered By: Geralyn Corwin on 04/26/2023 10:27:08 -------------------------------------------------------------------------------- Problem List Details Patient Name: Date of Service: Sharon [name], Sharon NIA A. 04/26/2023 9:15 A M Medical Record Number: [id number] Patient Account Number: [id number] Date of Birth/Sex: Treating RN: February 08, 1966 (57 y.o. Arta Silence Primary Care Provider: Benny Lennert Other Clinician: Referring Provider: Treating Provider/Extender: Skeet Latch in Treatment: 2 Active Problems ICD-10 Encounter Code Description Active Date MDM Diagnosis T81.31XD Disruption of external operation (surgical) wound, not elsewhere classified, 04/12/2023 No Yes subsequent encounter S31.109S Unspecified open wound of abdominal wall, unspecified quadrant without 04/12/2023 No Yes penetration into peritoneal cavity, sequela E11.622 Type 2 diabetes mellitus with other skin ulcer 04/12/2023 No Yes Inactive Problems Resolved Problems Electronic Signature(s) Signed: 04/26/2023 4:07:01 PM By: Geralyn Corwin DO Entered By: Geralyn Corwin on 04/26/2023 10:20:41 Deliah Goody A ([id number]) [id number] Page 4 of 7 -------------------------------------------------------------------------------- Progress Note Details Patient Name: Date of Service: Sharon [name], Sharon NIA A. 04/26/2023 9:15 A M Medical Record Number: [id number] Patient Account Number: [id number] Date of Birth/Sex: Treating RN: 23-Mar-1966 (57 y.o. F) Primary Care Provider: Benny Lennert Other Clinician: Referring Provider: Treating Provider/Extender: Skeet Latch in Treatment: 2 Subjective Chief Complaint Information obtained from Patient 04/12/2023; patient is here for review of nonhealing wounds and an abdominoplasty incision on April 23/24 History of Present Illness (HPI) ADMISSION 04/12/2023 This is a 57 year old  woman with type 2 diabetes who underwent an abdominoplasty on April 23/2024. She states that the wound was closed but dehisced in 3 areas perhaps 2 to 3 weeks after the surgery. She has been using ABD pads and abdominal binder. Her surgery was by Dr. Marya Amsler plastic surgery in [place]. Past medical history type 2 diabetes, hypertension 6/20; patient presents for follow-up. She has been using collagen to the wound beds without issues. The left side wound has healed. She has 2 small remaining wounds 1 to the abdomen and another to the right side. She has no issues or complaints today. Patient History Information obtained from Patient. Family History Unknown History. Social History Never smoker, Caffeine Use - Moderate. Medical History Endocrine Patient has history of Type II Diabetes Hospitalization/Surgery History - Abdominoplasty (02/27/23);Excisional Breast surgery- Bilateral 1978. Objective Constitutional respirations regular,  non-labored and within target range for patient.. Vitals Time Taken: 9:48 AM, Height: 66 in, Weight: 210 lbs, BMI: 33.9, Temperature: 98.4 F, Pulse: 66 bpm, Respiratory Rate: 20 breaths/min, Blood Pressure: 142/88 mmHg. Cardiovascular 2+ dorsalis pedis/posterior tibialis pulses. Psychiatric pleasant and cooperative. General Notes: 2 small open wounds with granulation tissue at the opening 1 located to the abdomen and the other on the right side. Mostly epithelization to the incision site. No signs of infection. No drainage noted. Integumentary (Hair, Skin) Wound #1 status is Open. Original cause of wound was Surgical Injury. The date acquired was: 02/27/2023. The wound has been in treatment 2 weeks. The wound is located on the Right Abdomen - Lower Quadrant. The wound measures 0.2cm length x 0.3cm width x 0.1cm depth; 0.047cm^2 area and 0.005cm^3 volume. There is Fat Layer (Subcutaneous Tissue) exposed. There is no tunneling or undermining noted. There is a small amount  of serosanguineous drainage noted. The wound margin is distinct with the outline attached to the wound base. There is medium (34-66%) pink granulation within the wound bed. There is a medium (34-66%) amount of necrotic tissue within the wound bed including Adherent Slough. The periwound skin appearance had no abnormalities noted for moisture. The periwound skin appearance exhibited: Scarring, Cyanosis. The periwound skin appearance did not exhibit: Callus, Crepitus, Excoriation, ELLISYN, TEUBERT A ([id number]) [id number] Page 5 of 7 Induration, Rash, Atrophie Blanche, Ecchymosis, Hemosiderin Staining, Mottled, Pallor, Rubor, Erythema. Periwound temperature was noted as No Abnormality. Wound #2 status is Open. Original cause of wound was Gradually Appeared. The date acquired was: 02/27/2023. The wound has been in treatment 2 weeks. The wound is located on the Midline Abdomen - midline. The wound measures 0.2cm length x 0.2cm width x 0.2cm depth; 0.031cm^2 area and 0.006cm^3 volume. There is Fat Layer (Subcutaneous Tissue) exposed. There is no tunneling or undermining noted. There is a small amount of serosanguineous drainage noted. The wound margin is distinct with the outline attached to the wound base. There is large (67-100%) red granulation within the wound bed. There is no necrotic tissue within the wound bed. The periwound skin appearance had no abnormalities noted for moisture. The periwound skin appearance had no abnormalities noted for color. The periwound skin appearance did not exhibit: Callus, Crepitus, Excoriation, Induration, Rash, Scarring. Periwound temperature was noted as No Abnormality. Wound #3 status is Open. Original cause of wound was Surgical Injury. The date acquired was: 02/27/2023. The wound has been in treatment 2 weeks. The wound is located on the Left Abdomen - Lower Quadrant. The wound measures 0cm length x 0cm width x 0cm depth; 0cm^2 area and  0cm^3 volume. There is no tunneling or undermining noted. There is a none present amount of drainage noted. There is no granulation within the wound bed. There is no necrotic tissue within the wound bed. The periwound skin appearance had no abnormalities noted for moisture. The periwound skin appearance had no abnormalities noted for color. The periwound skin appearance did not exhibit: Callus, Crepitus, Excoriation, Induration, Rash, Scarring. Periwound temperature was noted as No Abnormality. Assessment Active Problems ICD-10 Disruption of external operation (surgical) wound, not elsewhere classified, subsequent encounter Unspecified open wound of abdominal wall, unspecified quadrant without penetration into peritoneal cavity, sequela Type 2 diabetes mellitus with other skin ulcer Patient's wounds appear well-healing. I recommended continuing collagen. We gave her a note to stay out of work until next evaluation. Hopefully she will be healed next week. Plan Follow-up Appointments: Return Appointment in 1 week. - 130pm  Thursday 05/03/2023 room 7 Other: - Patient to wait one more week to be out of work. Will reevaluate next week when to return to work. Anesthetic: (In clinic) Topical Lidocaine 4% applied to wound bed - Used in clinic Bathing/ Shower/ Hygiene: May shower and wash wound with soap and water. - Change dressing after showering/bathing, if Sharon desired. Additional Orders / Instructions: Other: - Please do not have waistband of pants/underpants over the abdomen surgical site. Continue to wear abdominal binder. WOUND #1: - Abdomen - Lower Quadrant Wound Laterality: Right Prim Dressing: Promogran Prisma Matrix, 4.34 (sq in) (silver collagen) (Generic) 1 x Per Day/30 Days ary Discharge Instructions: Moisten collagen with saline or hydrogel Secondary Dressing: ABD Pad, 8x10 (Generic) 1 x Per Day/30 Days Discharge Instructions: Apply over primary dressing as directed. Secondary  Dressing: Bordered Gauze, 2x2 in (Generic) 1 x Per Day/30 Days Discharge Instructions: Apply over primary dressing as directed. Add-Ons: Skintegrity Hydrogel (Generic) 1 x Per Day/30 Days WOUND #2: - Abdomen - midline Wound Laterality: Midline Prim Dressing: Promogran Prisma Matrix, 4.34 (sq in) (silver collagen) (Generic) 1 x Per Day/30 Days ary Discharge Instructions: Moisten collagen with saline or hydrogel Secondary Dressing: ABD Pad, 8x10 (Generic) 1 x Per Day/30 Days Discharge Instructions: Apply over primary dressing as directed. Secondary Dressing: Bordered Gauze, 2x3.75 in (Generic) 1 x Per Day/30 Days Discharge Instructions: Apply over primary dressing as directed. Secondary Dressing: 1 x Per Day/30 Days Add-Ons: Skintegrity Hydrogel (Generic) 1 x Per Day/30 Days 1. Collagen 2. Follow-up in 1 week Electronic Signature(s) Signed: 04/26/2023 4:07:01 PM By: Geralyn Corwin DO Entered By: Geralyn Corwin on 04/26/2023 10:29:53 Deliah Goody A ([id number]) [id number] Page 6 of 7 -------------------------------------------------------------------------------- HxROS Details Patient Name: Date of Service: Sharon [name], Sharon NIA A. 04/26/2023 9:15 A M Medical Record Number: [id number] Patient Account Number: [id number] Date of Birth/Sex: Treating RN: Mar 13, 1966 (57 y.o. F) Primary Care Provider: Benny Lennert Other Clinician: Referring Provider: Treating Provider/Extender: Skeet Latch in Treatment: 2 Information Obtained From Patient Endocrine Medical History: Positive for: Type II Diabetes Time with diabetes: On Mounjaro 1 year Immunizations Pneumococcal Vaccine: Received Pneumococcal Vaccination: No Implantable Devices None Hospitalization / Surgery History Type of Hospitalization/Surgery Abdominoplasty (02/27/23);Excisional Breast surgery- Bilateral 1978 Family and Social History Unknown History: Yes; Never smoker; Caffeine  Use: Moderate; Financial Concerns: No; Food, Clothing or Shelter Needs: No; Support System Lacking: No; Transportation Concerns: No Electronic Signature(s) Signed: 04/26/2023 4:07:01 PM By: Geralyn Corwin DO Entered By: Geralyn Corwin on 04/26/2023 10:21:27 -------------------------------------------------------------------------------- SuperBill Details Patient Name: Date of Service: Sharon [name], Sharon NIA A. 04/26/2023 Medical Record Number: [id number] Patient Account Number: [id number] Date of Birth/Sex: Treating RN: [birth year] (57 y.o. Arta Silence Primary Care Provider: Benny Lennert Other Clinician: Referring Provider: Treating Provider/Extender: Skeet Latch in Treatment: 2 Diagnosis Coding ICD-10 Codes Code Description T81.31XD Disruption of external operation (surgical) wound, not elsewhere classified, subsequent encounter S31.109S Unspecified open wound of abdominal wall, unspecified quadrant without penetration into peritoneal cavity, sequela E11.622 Type 2 diabetes mellitus with other skin ulcer Facility Procedures : CPT4 Code: [id number] Description: 9214 - WOUND CARE VISIT-LEV 4 EST PT Modifier: Quantity: 1 Physician Procedures : CPT4 Code Description Modifier [id number] - WC PHYS LEVEL 3 - EST PT AVELIN, SALING A ([id number]) [id number]. ICD-10 Diagnosis Description T81.31XD Disruption of external operation (surgical) wound, not elsewhere classified,  subsequent encounter S31.109S Unspecified open wound of abdominal wall, unspecified quadrant without penetration into peritoneal cavit E11.622 Type 2 diabetes mellitus  with other skin ulcer Quantity: 1 pdf Page 7 of 7 y, sequela Electronic Signature(s) Signed: 04/26/2023 4:07:01 PM By: Geralyn Corwin DO Entered By: Geralyn Corwin on 04/26/2023 10:30:06

## 2023-04-27 NOTE — Progress Notes (Signed)
RUT, BETTERTON A ([id number]) [id number] Page 1 of 11 Visit Report for 04/26/2023 Arrival Information Details Patient Name: Date of Service: [place] RD, SO NIA A. 04/26/2023 9:15 A M Medical Record Number: [id number] Patient Account Number: [id number] Date of Birth/Sex: Treating RN: 1966/01/03 (57 y.o. F) Primary Care Shenequa Howse: Benny Lennert Other Clinician: Referring Stanisha Lorenz: Treating Derrika Ruffalo/Extender: Skeet Latch in Treatment: 2 Visit Information History Since Last Visit All ordered tests and consults were completed: No Patient Arrived: Ambulatory Added or deleted any medications: No Arrival Time: 09:47 Any new allergies or adverse reactions: No Accompanied By: self Had a fall or experienced change in No Transfer Assistance: None activities of daily living that may affect Patient Identification Verified: Yes risk of falls: Secondary Verification Process Completed: Yes Signs or symptoms of abuse/neglect since last visito No Hospitalized since last visit: No Implantable device outside of the clinic excluding No cellular tissue based products placed in the center since last visit: Pain Present Now: No Electronic Signature(s) Signed: 04/26/2023 10:13:20 AM By: Dayton Scrape Entered By: Dayton Scrape on 04/26/2023 09:48:13 -------------------------------------------------------------------------------- Clinic Level of Care Assessment Details Patient Name: Date of Service: SHEA RD, SO NIA A. 04/26/2023 9:15 A M Medical Record Number: [id number] Patient Account Number: [id number] Date of Birth/Sex: Treating RN: [birth year] (57 y.o. Arta Silence Primary Care Pamelyn Bancroft: Benny Lennert Other Clinician: Referring Sandie Swayze: Treating Julez Huseby/Extender: Skeet Latch in Treatment: 2 Clinic Level of Care Assessment Items TOOL 4 Quantity Score X- 1 0 Use when only an EandM is performed on FOLLOW-UP visit ASSESSMENTS - Nursing  Assessment / Reassessment X- 1 10 Reassessment of Co-morbidities (includes updates in patient status) X- 1 5 Reassessment of Adherence to Treatment Plan ASSESSMENTS - Wound and Skin A ssessment / Reassessment []  - 0 Simple Wound Assessment / Reassessment - one wound X- 3 5 Complex Wound Assessment / Reassessment - multiple wounds []  - 0 Dermatologic / Skin Assessment (not related to wound area) ASSESSMENTS - Focused Assessment []  - 0 Circumferential Edema Measurements - multi extremities []  - 0 Nutritional Assessment / Counseling / Intervention FAYLINN, SCHWENN A ([id number]) [id number] Page 2 of 11 []  - 0 Lower Extremity Assessment (monofilament, tuning fork, pulses) []  - 0 Peripheral Arterial Disease Assessment (using hand held doppler) ASSESSMENTS - Ostomy and/or Continence Assessment and Care []  - 0 Incontinence Assessment and Management []  - 0 Ostomy Care Assessment and Management (repouching, etc.) PROCESS - Coordination of Care []  - 0 Simple Patient / Family Education for ongoing care X- 1 20 Complex (extensive) Patient / Family Education for ongoing care X- 1 10 Staff obtains Chiropractor, Records, T Results / Process Orders est []  - 0 Staff telephones HHA, Nursing Homes / Clarify orders / etc []  - 0 Routine Transfer to another Facility (non-emergent condition) []  - 0 Routine Hospital Admission (non-emergent condition) []  - 0 New Admissions / Manufacturing engineer / Ordering NPWT Apligraf, etc. , []  - 0 Emergency Hospital Admission (emergent condition) []  - 0 Simple Discharge Coordination X- 1 15 Complex (extensive) Discharge Coordination PROCESS - Special Needs []  - 0 Pediatric / Minor Patient Management []  - 0 Isolation Patient Management []  - 0 Hearing / Language / Visual special needs []  - 0 Assessment of Community assistance (transportation, D/C planning, etc.) []  - 0 Additional assistance / Altered mentation []  -  0 Support Surface(s) Assessment (bed, cushion, seat, etc.) INTERVENTIONS - Wound Cleansing / Measurement []  - 0 Simple Wound Cleansing - one wound X- 3 5 Complex Wound Cleansing -  multiple wounds X- 1 5 Wound Imaging (photographs - any number of wounds) []  - 0 Wound Tracing (instead of photographs) []  - 0 Simple Wound Measurement - one wound X- 3 5 Complex Wound Measurement - multiple wounds INTERVENTIONS - Wound Dressings X - Small Wound Dressing one or multiple wounds 3 10 []  - 0 Medium Wound Dressing one or multiple wounds []  - 0 Large Wound Dressing one or multiple wounds []  - 0 Application of Medications - topical []  - 0 Application of Medications - injection INTERVENTIONS - Miscellaneous []  - 0 External ear exam []  - 0 Specimen Collection (cultures, biopsies, blood, body fluids, etc.) []  - 0 Specimen(s) / Culture(s) sent or taken to Lab for analysis []  - 0 Patient Transfer (multiple staff / Nurse, adult / Similar devices) []  - 0 Simple Staple / Suture removal (25 or less) []  - 0 Complex Staple / Suture removal (26 or more) []  - 0 Hypo / Hyperglycemic Management (close monitor of Blood Glucose) Chenette, Nastassja A ([id number]) [id number] Page 3 of 11 []  - 0 Ankle / Brachial Index (ABI) - do not check if billed separately X- 1 5 Vital Signs Has the patient been seen at the hospital within the last three years: Yes Total Score: 145 Level Of Care: New/Established - Level 4 Electronic Signature(s) Signed: 04/26/2023 6:06:31 PM By: Shawn Stall RN, BSN Entered By: Shawn Stall on 04/26/2023 10:19:51 -------------------------------------------------------------------------------- Encounter Discharge Information Details Patient Name: Date of Service: SHEA RD, SO NIA A. 04/26/2023 9:15 A M Medical Record Number: [id number] Patient Account Number: [id number] Date of Birth/Sex: Treating RN: [birth year] (57 y.o. Arta Silence Primary Care  Raequon Catanzaro: Benny Lennert Other Clinician: Referring Wasim Hurlbut: Treating Demarion Pondexter/Extender: Skeet Latch in Treatment: 2 Encounter Discharge Information Items Discharge Condition: Stable Ambulatory Status: Ambulatory Discharge Destination: Home Transportation: Private Auto Accompanied By: self Schedule Follow-up Appointment: Yes Clinical Summary of Care: Electronic Signature(s) Signed: 04/26/2023 6:06:31 PM By: Shawn Stall RN, BSN Entered By: Shawn Stall on 04/26/2023 10:22:48 -------------------------------------------------------------------------------- Lower Extremity Assessment Details Patient Name: Date of Service: SHEA RD, SO NIA A. 04/26/2023 9:15 A M Medical Record Number: [id number] Patient Account Number: [id number] Date of Birth/Sex: Treating RN: 12-Aug-1966 (57 y.o. Arta Silence Primary Care Colden Samaras: Benny Lennert Other Clinician: Referring Thai Hemrick: Treating Maripaz Mullan/Extender: Skeet Latch in Treatment: 2 Electronic Signature(s) Signed: 04/26/2023 6:06:31 PM By: Shawn Stall RN, BSN Entered By: Shawn Stall on 04/26/2023 09:55:54 -------------------------------------------------------------------------------- Multi Wound Chart Details Patient Name: Date of Service: SHEA RD, SO NIA A. 04/26/2023 9:15 A M Medical Record Number: [id number] Patient Account Number: [id number] RUHANI, UMLAND A ([id number]) [id number] Page 4 of 11 Date of Birth/Sex: Treating RN: [birth year] (57 y.o. F) Primary Care Shafin Pollio: Other Clinician: Benny Lennert Referring Yury Schaus: Treating Lyle Leisner/Extender: Skeet Latch in Treatment: 2 Vital Signs Height(in): 66 Pulse(bpm): 66 Weight(lbs): 210 Blood Pressure(mmHg): 142/88 Body Mass Index(BMI): 33.9 Temperature(F): 98.4 Respiratory Rate(breaths/min): 20 [1:Photos:] Right Abdomen - Lower Quadrant Midline Abdomen - midline Left Abdomen - Lower  Quadrant Wound Location: Surgical Injury Gradually Appeared Surgical Injury Wounding Event: Dehisced Wound Dehisced Wound Dehisced Wound Primary Etiology: Type II Diabetes Type II Diabetes Type II Diabetes Comorbid History: 02/27/2023 02/27/2023 02/27/2023 Date Acquired: 2 2 2  Weeks of Treatment: Open Open Open Wound Status: No No No Wound Recurrence: No Yes Yes Clustered Wound: N/A 1 N/A Clustered Quantity: 0.2x0.3x0.1 0.2x0.2x0.2 0x0x0 Measurements L x W x D (cm) 0.047 0.031 0 A (cm) : rea 0.005 0.006 0 Volume (  cm) : 90.80% 98.10% 100.00% % Reduction in A rea: 90.20% 96.40% 100.00% % Reduction in Volume: Full Thickness Without Exposed Full Thickness Without Exposed Full Thickness Without Exposed Classification: Support Structures Support Structures Support Structures Dealer None Present Exudate Amount: Serosanguineous Serosanguineous N/A Exudate Type: red, brown red, brown N/A Exudate Color: Distinct, outline attached Distinct, outline attached N/A Wound Margin: Medium (34-66%) Large (67-100%) None Present (0%) Granulation Amount: Pink Red N/A Granulation Quality: Medium (34-66%) None Present (0%) None Present (0%) Necrotic Amount: Fat Layer (Subcutaneous Tissue): Yes Fat Layer (Subcutaneous Tissue): Yes Fascia: No Exposed Structures: Fascia: No Fascia: No Fat Layer (Subcutaneous Tissue): No Tendon: No Tendon: No Tendon: No Muscle: No Muscle: No Muscle: No Joint: No Joint: No Joint: No Bone: No Bone: No Bone: No Large (67-100%) Large (67-100%) Large (67-100%) Epithelialization: Scarring: Yes Excoriation: No Excoriation: No Periwound Skin Texture: Excoriation: No Induration: No Induration: No Induration: No Callus: No Callus: No Callus: No Crepitus: No Crepitus: No Crepitus: No Rash: No Rash: No Rash: No Scarring: No Scarring: No Maceration: No Maceration: No Maceration: No Periwound Skin Moisture: Dry/Scaly: No Dry/Scaly:  No Dry/Scaly: No Cyanosis: Yes Atrophie Blanche: No Atrophie Blanche: No Periwound Skin Color: Atrophie Blanche: No Cyanosis: No Cyanosis: No Ecchymosis: No Ecchymosis: No Ecchymosis: No Erythema: No Erythema: No Erythema: No Hemosiderin Staining: No Hemosiderin Staining: No Hemosiderin Staining: No Mottled: No Mottled: No Mottled: No Pallor: No Pallor: No Pallor: No Rubor: No Rubor: No Rubor: No No Abnormality No Abnormality No Abnormality Temperature: Treatment Notes Electronic Signature(s) Signed: 04/26/2023 4:07:01 PM By: Geralyn Corwin DO Entered By: Geralyn Corwin on 04/26/2023 10:20:47 Deliah Goody A ([id number]) [id number] Page 5 of 11 -------------------------------------------------------------------------------- Multi-Disciplinary Care Plan Details Patient Name: Date of Service: SHEA RD, SO NIA A. 04/26/2023 9:15 A M Medical Record Number: [id number] Patient Account Number: [id number] Date of Birth/Sex: Treating RN: April 23, 1966 (57 y.o. Arta Silence Primary Care Vyolet Sakuma: Benny Lennert Other Clinician: Referring Raylie Maddison: Treating Tremon Sainvil/Extender: Skeet Latch in Treatment: 2 Active Inactive Wound/Skin Impairment Nursing Diagnoses: Impaired tissue integrity Goals: Patient/caregiver will verbalize understanding of skin care regimen Date Initiated: 04/12/2023 Target Resolution Date: 09/12/2023 Goal Status: Active Interventions: Assess ulceration(s) every visit Treatment Activities: Skin care regimen initiated : 04/12/2023 Notes: Electronic Signature(s) Signed: 04/26/2023 6:06:31 PM By: Shawn Stall RN, BSN Entered By: Shawn Stall on 04/26/2023 09:55:37 -------------------------------------------------------------------------------- Pain Assessment Details Patient Name: Date of Service: SHEA RD, SO NIA A. 04/26/2023 9:15 A M Medical Record Number: [id number] Patient Account Number:  [id number] Date of Birth/Sex: Treating RN: 01-30-1966 (57 y.o. F) Primary Care Randy Whitener: Benny Lennert Other Clinician: Referring Kairen Hallinan: Treating Kacper Cartlidge/Extender: Skeet Latch in Treatment: 2 Active Problems Location of Pain Severity and Description of Pain Patient Has Paino No Site Locations DEVONY, MCGRADY A ([id number]) [id number] Page 6 of 11 Pain Management and Medication Current Pain Management: Electronic Signature(s) Signed: 04/26/2023 10:13:20 AM By: Dayton Scrape Entered By: Dayton Scrape on 04/26/2023 09:48:42 -------------------------------------------------------------------------------- Patient/Caregiver Education Details Patient Name: Date of Service: SHEA RD, SO NIA A. 6/20/2024andnbsp9:15 A M Medical Record Number: [id number] Patient Account Number: [id number] Date of Birth/Gender: Treating RN: [birth year] (57 y.o. Arta Silence Primary Care Physician: Benny Lennert Other Clinician: Referring Physician: Treating Physician/Extender: Skeet Latch in Treatment: 2 Education Assessment Education Provided To: Patient Education Topics Provided Wound/Skin Impairment: Handouts: Caring for Your Ulcer Methods: Explain/Verbal Responses: Reinforcements needed Electronic Signature(s) Signed: 04/26/2023 6:06:31 PM By: Shawn Stall RN, BSN Entered By: Shawn Stall on  04/26/2023 09:55:45 -------------------------------------------------------------------------------- Wound Assessment Details Patient Name: Date of Service: SHEA RD, SO NIA A. 04/26/2023 9:15 A M Medical Record Number: [id number] Patient Account Number: [id number] Date of Birth/Sex: Treating RN: May 02, 1966 (57 y.o. Arta Silence Primary Care Ryana Montecalvo: Benny Lennert Other Clinician: Ainsley Spinner ([id number]) [id number] Page 7 of 11 Referring Rushi Chasen: Treating Dryden Tapley/Extender: Skeet Latch in Treatment: 2 Wound Status Wound Number: 1 Primary Etiology: Dehisced Wound Wound Location: Right Abdomen - Lower Quadrant Wound Status: Open Wounding Event: Surgical Injury Comorbid History: Type II Diabetes Date Acquired: 02/27/2023 Weeks Of Treatment: 2 Clustered Wound: No Photos Wound Measurements Length: (cm) Width: (cm) Depth: (cm) Area: (cm) Volume: (cm) 0.2 % Reduction in Area: 90.8% 0.3 % Reduction in Volume: 90.2% 0.1 Epithelialization: Large (67-100%) 0.047 Tunneling: No 0.005 Undermining: No Wound Description Classification: Full Thickness Without Exposed Suppor Wound Margin: Distinct, outline attached Exudate Amount: Small Exudate Type: Serosanguineous Exudate Color: red, brown t Structures Foul Odor After Cleansing: No Slough/Fibrino Yes Wound Bed Granulation Amount: Medium (34-66%) Exposed Structure Granulation Quality: Pink Fascia Exposed: No Necrotic Amount: Medium (34-66%) Fat Layer (Subcutaneous Tissue) Exposed: Yes Necrotic Quality: Adherent Slough Tendon Exposed: No Muscle Exposed: No Joint Exposed: No Bone Exposed: No Periwound Skin Texture Texture Color No Abnormalities Noted: No No Abnormalities Noted: No Callus: No Atrophie Blanche: No Crepitus: No Cyanosis: Yes Excoriation: No Ecchymosis: No Induration: No Erythema: No Rash: No Hemosiderin Staining: No Scarring: Yes Mottled: No Pallor: No Moisture Rubor: No No Abnormalities Noted: Yes Temperature / Pain Temperature: No Abnormality Treatment Notes Wound #1 (Abdomen - Lower Quadrant) Wound Laterality: Right Cleanser Peri-Wound Care Topical Primary Dressing Promogran Prisma Matrix, 4.34 (sq in) (silver collagen) Goldin, Vearl A ([id number]) [id number] Page 8 of 11 Discharge Instruction: Moisten collagen with saline or hydrogel Secondary Dressing ABD Pad, 8x10 Discharge Instruction: Apply over primary dressing as  directed. Bordered Gauze, 2x2 in Discharge Instruction: Apply over primary dressing as directed. Secured With Compression Wrap Compression Stockings Add-Ons Conservation officer, historic buildings) Signed: 04/26/2023 6:06:31 PM By: Shawn Stall RN, BSN Entered By: Shawn Stall on 04/26/2023 10:03:54 -------------------------------------------------------------------------------- Wound Assessment Details Patient Name: Date of Service: SHEA RD, SO NIA A. 04/26/2023 9:15 A M Medical Record Number: [id number] Patient Account Number: [id number] Date of Birth/Sex: Treating RN: [birth year] (57 y.o. Debara Pickett, Millard.Loa Primary Care Rikayla Demmon: Benny Lennert Other Clinician: Referring Keshara Kiger: Treating Jhalil Silvera/Extender: Skeet Latch in Treatment: 2 Wound Status Wound Number: 2 Primary Etiology: Dehisced Wound Wound Location: Midline Abdomen - midline Wound Status: Open Wounding Event: Gradually Appeared Comorbid History: Type II Diabetes Date Acquired: 02/27/2023 Weeks Of Treatment: 2 Clustered Wound: Yes Photos Wound Measurements Length: (cm) Width: (cm) Depth: (cm) Clustered Quantity: Area: (cm) Volume: (cm) 0.2 % Reduction in Area: 98.1% 0.2 % Reduction in Volume: 96.4% 0.2 Epithelialization: Large (67-100%) 1 Tunneling: No 0.031 Undermining: No 0.006 Wound Description Classification: Full Thickness Without Exposed Support Structures Wound Margin: Distinct, outline attached Exudate Amount: Small Exudate Type: Serosanguineous Exudate Color: red, brown Pardue, Davis A ([id number]) Wound Bed Granulation Amount: Large (67-100%) Granulation Quality: Red Necrotic Amount: None Present (0%) Foul Odor After Cleansing: No Slough/Fibrino Yes [phone].pdf Page 9 of 11 Exposed Structure Fascia Exposed: No Fat Layer (Subcutaneous Tissue) Exposed: Yes Tendon Exposed: No Muscle Exposed: No Joint Exposed: No Bone Exposed:  No Periwound Skin Texture Texture Color No Abnormalities Noted: No No Abnormalities Noted: Yes Callus: No Temperature / Pain Crepitus: No Temperature: No Abnormality Excoriation: No Induration: No Rash: No  Scarring: No Moisture No Abnormalities Noted: Yes Treatment Notes Wound #2 (Abdomen - midline) Wound Laterality: Midline Cleanser Peri-Wound Care Topical Primary Dressing Promogran Prisma Matrix, 4.34 (sq in) (silver collagen) Discharge Instruction: Moisten collagen with saline or hydrogel Secondary Dressing ABD Pad, 8x10 Discharge Instruction: Apply over primary dressing as directed. Bordered Gauze, 2x3.75 in Discharge Instruction: Apply over primary dressing as directed. Secured With Compression Wrap Compression Stockings Add-Ons Conservation officer, historic buildings) Signed: 04/26/2023 6:06:31 PM By: Shawn Stall RN, BSN Entered By: Shawn Stall on 04/26/2023 10:03:02 -------------------------------------------------------------------------------- Wound Assessment Details Patient Name: Date of Service: SHEA RD, SO NIA A. 04/26/2023 9:15 A M Medical Record Number: [id number] Patient Account Number: [id number] Date of Birth/Sex: Treating RN: [birth year] (56 y.o. Arta Silence Primary Care Connelly Spruell: Benny Lennert Other Clinician: Referring Birtha Hatler: Treating Lakeyta Vandenheuvel/Extender: Skeet Latch in Treatment: 2 Wound Status Wound Number: 3 Primary Etiology: Dehisced Wound Wound Location: Left Abdomen - Lower Quadrant Wound Status: Open Wounding Event: Surgical Injury Comorbid History: Type II Diabetes KYRAN, WHITTIER A ([id number]) [id number] Page 10 of 11 Date Acquired: 02/27/2023 Weeks Of Treatment: 2 Clustered Wound: Yes Photos Wound Measurements Length: (cm) Width: (cm) Depth: (cm) Area: (cm) Volume: (cm) 0 % Reduction in Area: 100% 0 % Reduction in Volume: 100% 0 Epithelialization: Large (67-100%) 0  Tunneling: No 0 Undermining: No Wound Description Classification: Full Thickness Without Exposed Support Exudate Amount: None Present Structures Foul Odor After Cleansing: No Slough/Fibrino No Wound Bed Granulation Amount: None Present (0%) Exposed Structure Necrotic Amount: None Present (0%) Fascia Exposed: No Fat Layer (Subcutaneous Tissue) Exposed: No Tendon Exposed: No Muscle Exposed: No Joint Exposed: No Bone Exposed: No Periwound Skin Texture Texture Color No Abnormalities Noted: No No Abnormalities Noted: Yes Callus: No Temperature / Pain Crepitus: No Temperature: No Abnormality Excoriation: No Induration: No Rash: No Scarring: No Moisture No Abnormalities Noted: Yes Electronic Signature(s) Signed: 04/26/2023 6:06:31 PM By: Shawn Stall RN, BSN Entered By: Shawn Stall on 04/26/2023 10:02:39 -------------------------------------------------------------------------------- Vitals Details Patient Name: Date of Service: SHEA RD, SO NIA A. 04/26/2023 9:15 A M Medical Record Number: [id number] Patient Account Number: [id number] Date of Birth/Sex: Treating RN: [birth year] (57 y.o. F) Primary Care Myda Detwiler: Benny Lennert Other Clinician: Referring Jamarco Zaldivar: Treating Estellar Cadena/Extender: Skeet Latch in Treatment: 2 Vital Signs PRECILLA, PURNELL A ([id number]) [id number] Page 11 of 11 Time Taken: 09:48 Temperature (F): 98.4 Height (in): 66 Pulse (bpm): 66 Weight (lbs): 210 Respiratory Rate (breaths/min): 20 Body Mass Index (BMI): 33.9 Blood Pressure (mmHg): 142/88 Reference Range: 80 - 120 mg / dl Electronic Signature(s) Signed: 04/26/2023 10:13:20 AM By: Dayton Scrape Entered By: Dayton Scrape on 04/26/2023 09:48:34

## 2023-05-03 ENCOUNTER — Encounter (HOSPITAL_BASED_OUTPATIENT_CLINIC_OR_DEPARTMENT_OTHER): Payer: Federal, State, Local not specified - PPO | Admitting: Internal Medicine

## 2023-05-03 DIAGNOSIS — S31109S Unspecified open wound of abdominal wall, unspecified quadrant without penetration into peritoneal cavity, sequela: Secondary | ICD-10-CM | POA: Diagnosis not present

## 2023-05-03 DIAGNOSIS — T8131XD Disruption of external operation (surgical) wound, not elsewhere classified, subsequent encounter: Secondary | ICD-10-CM

## 2023-05-03 DIAGNOSIS — E11622 Type 2 diabetes mellitus with other skin ulcer: Secondary | ICD-10-CM

## 2023-05-04 NOTE — Progress Notes (Signed)
Sharon [name], Sharon [name] ([id number]) [id number] Page 1 of 9 Visit Report for 05/03/2023 Arrival Information Details Patient Name: Date of Service: Sharon [name], Sharon [name]. 05/03/2023 2:00 PM Medical Record Number: [id number] Patient Account Number: [id number] Date of Birth/Sex: Treating [name]: 09-14-1966 (57 y.o. Sharon [name] Primary Care Sharon [name]: Sharon [name] Other Clinician: Referring Sharon [name]: Treating Sharon [name]/Extender: Sharon [name] in Treatment: 3 Visit Information History Since Last Visit Added or deleted any medications: No Patient Arrived: Ambulatory Any new allergies or adverse reactions: No Arrival Time: 14:22 Had [name] fall or experienced change in No Accompanied By: self activities of daily living that may affect Transfer Assistance: None risk of falls: Patient Identification Verified: Yes Signs or symptoms of abuse/neglect since last visito No Hospitalized since last visit: No Implantable device outside of the clinic excluding No cellular tissue based products placed in the center since last visit: Has Dressing in Place as Prescribed: Yes Pain Present Now: No Electronic Signature(s) Signed: 05/03/2023 5:38:35 PM By: Sharon Schwalbe [name] Entered By: Sharon [name] on 05/03/2023 14:22:59 -------------------------------------------------------------------------------- Clinic Level of Care Assessment Details Patient Name: Date of Service: Sharon [name], Sharon [name]. 05/03/2023 2:00 PM Medical Record Number: [id number] Patient Account Number: [id number] Date of Birth/Sex: Treating [name]: 22-Apr-1966 (57 y.o. Sharon [name] Primary Care Sharon [name]: Sharon [name] Other Clinician: Referring Sharon [name]: Treating Sharon [name]/Extender: Sharon [name] in Treatment: 3 Clinic Level of Care Assessment Items TOOL 4 Quantity Score X- 1 0 Use when only an EandM is performed on FOLLOW-UP visit ASSESSMENTS - Nursing Assessment / Reassessment X-  1 10 Reassessment of Co-morbidities (includes updates in patient status) X- 1 5 Reassessment of Adherence to Treatment Plan ASSESSMENTS - Wound and Skin [name] ssessment / Reassessment []  - 0 Simple Wound Assessment / Reassessment - one wound X- 2 5 Complex Wound Assessment / Reassessment - multiple wounds []  - 0 Dermatologic / Skin Assessment (not related to wound area) ASSESSMENTS - Focused Assessment []  - 0 Circumferential Edema Measurements - multi extremities []  - 0 Nutritional Assessment / Counseling / Intervention Sharon [name] ([id number]) [phone].pdf Page 2 of 9 []  - 0 Lower Extremity Assessment (monofilament, tuning fork, pulses) []  - 0 Peripheral Arterial Disease Assessment (using hand held doppler) ASSESSMENTS - Ostomy and/or Continence Assessment and Care []  - 0 Incontinence Assessment and Management []  - 0 Ostomy Care Assessment and Management (repouching, etc.) PROCESS - Coordination of Care X - Simple Patient / Family Education for ongoing care 1 15 []  - 0 Complex (extensive) Patient / Family Education for ongoing care X- 1 10 Staff obtains Chiropractor, Records, T Results / Process Orders est X- 1 10 Staff telephones HHA, Nursing Homes / Clarify orders / etc []  - 0 Routine Transfer to another Facility (non-emergent condition) []  - 0 Routine Hospital Admission (non-emergent condition) []  - 0 New Admissions / Manufacturing engineer / Ordering NPWT Apligraf, etc. , []  - 0 Emergency Hospital Admission (emergent condition) X- 1 10 Simple Discharge Coordination []  - 0 Complex (extensive) Discharge Coordination PROCESS - Special Needs []  - 0 Pediatric / Minor Patient Management []  - 0 Isolation Patient Management []  - 0 Hearing / Language / Visual special needs []  - 0 Assessment of Community assistance (transportation, D/C planning, etc.) []  - 0 Additional assistance / Altered mentation []  - 0 Support Surface(s) Assessment  (bed, cushion, seat, etc.) INTERVENTIONS - Wound Cleansing / Measurement []  - 0 Simple Wound Cleansing - one wound []  - 0 Complex Wound Cleansing - multiple wounds []  -  0 Wound Imaging (photographs - any number of wounds) []  - 0 Wound Tracing (instead of photographs) []  - 0 Simple Wound Measurement - one wound []  - 0 Complex Wound Measurement - multiple wounds INTERVENTIONS - Wound Dressings X - Small Wound Dressing one or multiple wounds 1 10 []  - 0 Medium Wound Dressing one or multiple wounds []  - 0 Large Wound Dressing one or multiple wounds []  - 0 Application of Medications - topical []  - 0 Application of Medications - injection INTERVENTIONS - Miscellaneous []  - 0 External ear exam []  - 0 Specimen Collection (cultures, biopsies, blood, body fluids, etc.) []  - 0 Specimen(s) / Culture(s) sent or taken to Lab for analysis []  - 0 Patient Transfer (multiple staff / Nurse, adult / Similar devices) []  - 0 Simple Staple / Suture removal (25 or less) []  - 0 Complex Staple / Suture removal (26 or more) []  - 0 Hypo / Hyperglycemic Management (close monitor of Blood Glucose) Sharon [name] ([id number]) [id number] Page 3 of 9 []  - 0 Ankle / Brachial Index (ABI) - do not check if billed separately X- 1 5 Vital Signs Has the patient been seen at the hospital within the last three years: Yes Total Score: 85 Level Of Care: New/Established - Level 3 Electronic Signature(s) Signed: 05/03/2023 5:38:35 PM By: Sharon Schwalbe [name] Entered By: Sharon [name] on 05/03/2023 17:37:23 -------------------------------------------------------------------------------- Encounter Discharge Information Details Patient Name: Date of Service: Sharon [name], Sharon [name]. 05/03/2023 2:00 PM Medical Record Number: [id number] Patient Account Number: [id number] Date of Birth/Sex: Treating [name]: 10-Nov-1965 (57 y.o. Sharon [name] Primary Care Sharon [name]: Sharon [name] Other  Clinician: Referring Sharon [name]: Treating Sharon [name]/Extender: Sharon [name] in Treatment: 3 Encounter Discharge Information Items Discharge Condition: Stable Ambulatory Status: Ambulatory Discharge Destination: Home Transportation: Private Auto Accompanied By: self Schedule Follow-up Appointment: Yes Clinical Summary of Care: Patient Declined Electronic Signature(s) Signed: 05/03/2023 5:38:35 PM By: Sharon Schwalbe [name] Entered By: Sharon [name] on 05/03/2023 17:37:56 -------------------------------------------------------------------------------- Lower Extremity Assessment Details Patient Name: Date of Service: Sharon [name], Sharon [name]. 05/03/2023 2:00 PM Medical Record Number: [id number] Patient Account Number: [id number] Date of Birth/Sex: Treating [name]: 1965/11/30 (58 y.o. Sharon [name] Primary Care Pat Elicker: Sharon [name] Other Clinician: Referring Jadin Kagel: Treating Ruthella Kirchman/Extender: Sharon [name] in Treatment: 3 Electronic Signature(s) Signed: 05/03/2023 5:38:35 PM By: Sharon Schwalbe [name] Entered By: Sharon [name] on 05/03/2023 14:23:29 -------------------------------------------------------------------------------- Multi Wound Chart Details Patient Name: Date of Service: Sharon [name], Sharon [name]. 05/03/2023 2:00 PM Medical Record Number: [id number] Patient Account Number: [id number] CHAYCE, JOSEPHS [name] ([id number]) [phone].pdf Page 4 of 9 Date of Birth/Sex: Treating [name]: 05-Jan-1966 (57 y.o. F) Primary Care Mikhia Dusek: Other Clinician: Benny [name] Referring Jaisean Monteforte: Treating Jaaziah Schulke/Extender: Sharon [name] in Treatment: 3 Vital Signs Height(in): 66 Pulse(bpm): Weight(lbs): 210 Blood Pressure(mmHg): Body Mass Index(BMI): 33.9 Temperature(F): Respiratory Rate(breaths/min): 18 [1:Photos:] [N/[name]:N/[name]] Right Abdomen - Lower Quadrant Midline Abdomen - midline N/[name] Wound Location: Surgical Injury  Gradually Appeared N/[name] Wounding Event: Dehisced Wound Dehisced Wound N/[name] Primary Etiology: Type II Diabetes Type II Diabetes N/[name] Comorbid History: 02/27/2023 02/27/2023 N/[name] Date Acquired: 3 3 N/[name] Weeks of Treatment: Healed - Epithelialized Open N/[name] Wound Status: No No N/[name] Wound Recurrence: No Yes N/[name] Clustered Wound: N/[name] 1 N/[name] Clustered Quantity: 0x0x0 0.1x0.1x0.1 N/[name] Measurements L x W x D (cm) 0 0.008 N/[name] [name] (cm) : rea 0 0.001 N/[name] Volume (cm) : 100.00% 99.50% N/[name] % Reduction in [name] rea: 100.00% 99.40% N/[name] % Reduction in  Volume: Full Thickness Without Exposed Full Thickness Without Exposed N/[name] Classification: Support Structures Support Structures None Present Small N/[name] Exudate Amount: N/[name] Serosanguineous N/[name] Exudate Type: N/[name] red, brown N/[name] Exudate Color: Distinct, outline attached Distinct, outline attached N/[name] Wound Margin: None Present (0%) Large (67-100%) N/[name] Granulation Amount: N/[name] Red N/[name] Granulation Quality: None Present (0%) None Present (0%) N/[name] Necrotic Amount: Fascia: No Fat Layer (Subcutaneous Tissue): Yes N/[name] Exposed Structures: Fat Layer (Subcutaneous Tissue): No Fascia: No Tendon: No Tendon: No Muscle: No Muscle: No Joint: No Joint: No Bone: No Bone: No Large (67-100%) Large (67-100%) N/[name] Epithelialization: Excoriation: No Excoriation: No N/[name] Periwound Skin Texture: Induration: No Induration: No Callus: No Callus: No Crepitus: No Crepitus: No Rash: No Rash: No Scarring: No Scarring: No Maceration: No Maceration: No N/[name] Periwound Skin Moisture: Dry/Scaly: No Dry/Scaly: No Atrophie Blanche: No Atrophie Blanche: No N/[name] Periwound Skin Color: Cyanosis: No Cyanosis: No Ecchymosis: No Ecchymosis: No Erythema: No Erythema: No Hemosiderin Staining: No Hemosiderin Staining: No Mottled: No Mottled: No Pallor: No Pallor: No Rubor: No Rubor: No No Abnormality No Abnormality N/[name] Temperature: Treatment Notes Electronic  Signature(s) Signed: 05/03/2023 4:14:51 PM By: Geralyn Corwin DO Entered By: Geralyn Corwin on 05/03/2023 14:51:33 [place], Jamielynn [name] ([id number]) [id number] Page 5 of 9 -------------------------------------------------------------------------------- Multi-Disciplinary Care Plan Details Patient Name: Date of Service: Sharon [name], Sharon [name]. 05/03/2023 2:00 PM Medical Record Number: [id number] Patient Account Number: [id number] Date of Birth/Sex: Treating [name]: [birth year] (57 y.o. Sharon [name] Primary Care Lequita Meadowcroft: Sharon [name] Other Clinician: Referring Tell Rozelle: Treating Drea Jurewicz/Extender: Sharon [name] in Treatment: 3 Active Inactive Wound/Skin Impairment Nursing Diagnoses: Impaired tissue integrity Goals: Patient/caregiver will verbalize understanding of skin care regimen Date Initiated: 04/12/2023 Target Resolution Date: 09/12/2023 Goal Status: Active Interventions: Assess ulceration(s) every visit Treatment Activities: Skin care regimen initiated : 04/12/2023 Notes: Electronic Signature(s) Signed: 05/03/2023 5:38:35 PM By: Sharon Schwalbe [name] Entered By: Sharon [name] on 05/03/2023 17:36:17 -------------------------------------------------------------------------------- Pain Assessment Details Patient Name: Date of Service: Sharon [name], Sharon [name]. 05/03/2023 2:00 PM Medical Record Number: [id number] Patient Account Number: [id number] Date of Birth/Sex: Treating [name]: Nov 30, 1965 (57 y.o. Sharon [name] Primary Care Gaynell Eggleton: Sharon [name] Other Clinician: Referring Allyse Fregeau: Treating Jazlen Ogarro/Extender: Sharon [name] in Treatment: 3 Active Problems Location of Pain Severity and Description of Pain Patient Has Paino No Site Locations [place] [name] ([id number]) [id number] Page 6 of 9 Pain Management and Medication Current Pain Management: Electronic Signature(s) Signed: 05/03/2023  5:38:35 PM By: Sharon Schwalbe [name] Entered By: Sharon [name] on 05/03/2023 14:23:19 -------------------------------------------------------------------------------- Patient/Caregiver Education Details Patient Name: Date of Service: Sharon [name], Sharon [name]. 6/27/2024andnbsp2:00 PM Medical Record Number: [id number] Patient Account Number: [id number] Date of Birth/Gender: Treating [name]: 12-07-1965 (57 y.o. Sharon [name] Primary Care Physician: Sharon [name] Other Clinician: Referring Physician: Treating Physician/Extender: Sharon [name] in Treatment: 3 Education Assessment Education Provided To: Patient Education Topics Provided Wound/Skin Impairment: Methods: Explain/Verbal Responses: Return demonstration correctly Electronic Signature(s) Signed: 05/03/2023 5:38:35 PM By: Sharon Schwalbe [name] Entered By: Sharon [name] on 05/03/2023 17:36:30 -------------------------------------------------------------------------------- Wound Assessment Details Patient Name: Date of Service: Sharon [name], Sharon [name]. 05/03/2023 2:00 PM Medical Record Number: [id number] Patient Account Number: [id number] Date of Birth/Sex: Treating [name]: Nov 25, 1965 (57 y.o. Sharon [name] Primary Care Veroncia Jezek: Sharon [name] Other Clinician: Referring Yeiren Whitecotton: Treating Eileen Croswell/Extender: Henreitta Leber, Ofilia Neas ([id number]) [id number] Page 7 of 9 Weeks in Treatment: 3 Wound Status Wound Number: 1 Primary Etiology: Dehisced Wound Wound Location:  Right Abdomen - Lower Quadrant Wound Status: Healed - Epithelialized Wounding Event: Surgical Injury Comorbid History: Type II Diabetes Date Acquired: 02/27/2023 Weeks Of Treatment: 3 Clustered Wound: No Photos Wound Measurements Length: (cm) Width: (cm) Depth: (cm) Area: (cm) Volume: (cm) 0 % Reduction in Area: 100% 0 % Reduction in Volume: 100% 0 Epithelialization: Large (67-100%) 0 Tunneling: No 0  Undermining: No Wound Description Classification: Full Thickness Without Exposed Support Wound Margin: Distinct, outline attached Exudate Amount: None Present Structures Foul Odor After Cleansing: No Slough/Fibrino No Wound Bed Granulation Amount: None Present (0%) Exposed Structure Necrotic Amount: None Present (0%) Fascia Exposed: No Fat Layer (Subcutaneous Tissue) Exposed: No Tendon Exposed: No Muscle Exposed: No Joint Exposed: No Bone Exposed: No Periwound Skin Texture Texture Color No Abnormalities Noted: Yes No Abnormalities Noted: Yes Moisture Temperature / Pain No Abnormalities Noted: Yes Temperature: No Abnormality Treatment Notes Wound #1 (Abdomen - Lower Quadrant) Wound Laterality: Right Cleanser Peri-Wound Care Topical Primary Dressing Secondary Dressing Secured With Compression Wrap Compression Stockings Add-Ons Electronic Signature(s) Signed: 05/03/2023 5:38:35 PM By: Sharon Schwalbe [name] Raynald Kemp, Cassidee [name] ([id number]) [id number].pdf Page 8 of 9 Entered By: Sharon [name] on 05/03/2023 14:49:37 -------------------------------------------------------------------------------- Wound Assessment Details Patient Name: Date of Service: Sharon [name], Sharon [name]. 05/03/2023 2:00 PM Medical Record Number: [id number] Patient Account Number: [id number] Date of Birth/Sex: Treating [name]: 05-27-1966 (57 y.o. Sharon [name] Primary Care Katyana Trolinger: Sharon [name] Other Clinician: Referring Deionna Marcantonio: Treating Haydyn Girvan/Extender: Sharon [name] in Treatment: 3 Wound Status Wound Number: 2 Primary Etiology: Dehisced Wound Wound Location: Midline Abdomen - midline Wound Status: Open Wounding Event: Gradually Appeared Comorbid History: Type II Diabetes Date Acquired: 02/27/2023 Weeks Of Treatment: 3 Clustered Wound: Yes Photos Wound Measurements Length: (cm) Width: (cm) Depth: (cm) Clustered Quantity: Area: (cm) Volume: (cm) 0.1 %  Reduction in Area: 99.5% 0.1 % Reduction in Volume: 99.4% 0.1 Epithelialization: Large (67-100%) 1 Tunneling: No 0.008 Undermining: No 0.001 Wound Description Classification: Full Thickness Without Exposed Sup Wound Margin: Distinct, outline attached Exudate Amount: Small Exudate Type: Serosanguineous Exudate Color: red, brown port Structures Foul Odor After Cleansing: No Slough/Fibrino Yes Wound Bed Granulation Amount: Large (67-100%) Exposed Structure Granulation Quality: Red Fascia Exposed: No Necrotic Amount: None Present (0%) Fat Layer (Subcutaneous Tissue) Exposed: Yes Tendon Exposed: No Muscle Exposed: No Joint Exposed: No Bone Exposed: No Periwound Skin Texture Texture Color No Abnormalities Noted: No No Abnormalities Noted: Yes Callus: No Temperature / Pain Crepitus: No Temperature: No Abnormality Excoriation: No Induration: No Rash: No Scarring: No JOYELLE, DEBLASE [name] ([id number]) [id number] Page 9 of 9 Moisture No Abnormalities Noted: Yes Treatment Notes Wound #2 (Abdomen - midline) Wound Laterality: Midline Cleanser Peri-Wound Care Topical Primary Dressing Promogran Prisma Matrix, 4.34 (sq in) (silver collagen) Discharge Instruction: Moisten collagen with saline or hydrogel Secondary Dressing ABD Pad, 8x10 Discharge Instruction: Apply over primary dressing as directed. Bordered Gauze, 2x3.75 in Discharge Instruction: Apply over primary dressing as directed. Secured With Compression Wrap Compression Stockings Add-Ons Conservation officer, historic buildings) Signed: 05/03/2023 5:38:35 PM By: Sharon Schwalbe [name] Entered By: Sharon [name] on 05/03/2023 14:36:24 -------------------------------------------------------------------------------- Vitals Details Patient Name: Date of Service: Sharon [name], Sharon [name]. 05/03/2023 2:00 PM Medical Record Number: [id number] Patient Account Number: [id number] Date of Birth/Sex: Treating  [name]: 02-20-1966 (57 y.o. Sharon [name] Primary Care Makhiya Coburn: Sharon [name] Other Clinician: Referring Shontay Wallner: Treating Elsie Sakuma/Extender: Sharon [name] in Treatment: 3 Vital Signs Time Taken: 14:23 Temperature (F): 98.2 Height (in): 66 Pulse (bpm): 90 Weight (lbs):  210 Respiratory Rate (breaths/min): 18 Body Mass Index (BMI): 33.9 Blood Pressure (mmHg): 155/80 Reference Range: 80 - 120 mg / dl Electronic Signature(s) Signed: 05/03/2023 5:38:35 PM By: Sharon Schwalbe [name] Entered By: Sharon [name] on 05/03/2023 17:35:59

## 2023-05-04 NOTE — Progress Notes (Signed)
VERIDIANA, AZURE Sharon [name] ([id number]) [id number] Page 1 of 6 Visit Report for 05/03/2023 Chief Complaint Document Details Patient Name: Date of Service: Sharon Sharon [name], Sharon Sharon [name]. 05/03/2023 2:00 PM Medical Record Number: [id number] Patient Account Number: [id number] Date of Birth/Sex: Treating RN: 02/07/1966 (57 y.o. F) Primary Care Provider: Benny Lennert Other Clinician: Referring Provider: Treating Provider/Extender: Skeet Latch in Treatment: 3 Information Obtained from: Patient Chief Complaint 04/12/2023; patient is here for review of nonhealing wounds and an abdominoplasty incision on April 23/24 Electronic Signature(s) Signed: 05/03/2023 4:14:51 PM By: Geralyn Corwin DO Entered By: Geralyn Corwin on 05/03/2023 14:51:45 -------------------------------------------------------------------------------- HPI Details Patient Name: Date of Service: Sharon Sharon [name], Sharon Sharon [name]. 05/03/2023 2:00 PM Medical Record Number: [id number] Patient Account Number: [id number] Date of Birth/Sex: Treating RN: 09/28/1966 (57 y.o. F) Primary Care Provider: Benny Lennert Other Clinician: Referring Provider: Treating Provider/Extender: Skeet Latch in Treatment: 3 History of Present Illness HPI Description: ADMISSION 04/12/2023 This is Sharon [name] 57 year old woman with type 2 diabetes who underwent an abdominoplasty on April 23/2024. She states that the wound was closed but dehisced in 3 areas perhaps 2 to 3 weeks after the surgery. She has been using ABD pads and abdominal binder. Her surgery was by Dr. Marya Amsler plastic surgery in [place]. Past medical history type 2 diabetes, hypertension 6/20; patient presents for follow-up. She has been using collagen to the wound beds without issues. The left side wound has healed. She has 2 small remaining wounds 1 to the abdomen and another to the right side. She has no issues or complaints today. 6/27; patient presents for follow-up.  She has been using collagen to the wound beds. The right lateral wound has healed. The anterior wound is very small. Electronic Signature(s) Signed: 05/03/2023 4:14:51 PM By: Geralyn Corwin DO Entered By: Geralyn Corwin on 05/03/2023 14:52:18 Physical Exam Details -------------------------------------------------------------------------------- Ainsley Spinner ([id number]) [id number] Page 2 of 6 Patient Name: Date of Service: Sharon Sharon [name], Sharon Sharon [name]. 05/03/2023 2:00 PM Medical Record Number: [id number] Patient Account Number: [id number] Date of Birth/Sex: Treating RN: [birth year] (57 y.o. F) Primary Care Provider: Benny Lennert Other Clinician: Referring Provider: Treating Provider/Extender: Skeet Latch in Treatment: 3 Constitutional respirations regular, non-labored and within target range for patient.Marland Kitchen Psychiatric pleasant and cooperative. Notes Epithelization to the previous wound site on the right lateral side. 1 small remaining wound to the anterior incision along the abdomen. Granulation tissue present. No signs of infection. No drainage noted. Electronic Signature(s) Signed: 05/03/2023 4:14:51 PM By: Geralyn Corwin DO Entered By: Geralyn Corwin on 05/03/2023 14:53:00 -------------------------------------------------------------------------------- Physician Orders Details Patient Name: Date of Service: Sharon Sharon [name], Sharon Sharon [name]. 05/03/2023 2:00 PM Medical Record Number: [id number] Patient Account Number: [id number] Date of Birth/Sex: Treating RN: [birth year] (57 y.o. Katrinka Blazing Primary Care Provider: Benny Lennert Other Clinician: Referring Provider: Treating Provider/Extender: Skeet Latch in Treatment: 3 Verbal / Phone Orders: No Diagnosis Coding Follow-up Appointments ppointment in 1 week. - Dr. Mikey Bussing Room 9 (Appointment for 7-10 days with Dr. Mikey Bussing) Return Sharon [name] Other: - Patient to wait one more week  to be out of work. Will re-evaluate next week when to return to work. Anesthetic (In clinic) Topical Lidocaine 4% applied to wound bed - Used in clinic Bathing/ Shower/ Hygiene May shower and wash wound with soap and water. - Change dressing after showering/bathing, if Sharon desired. Additional Orders / Instructions Other: - Please do not have waistband of pants/underpants over the abdomen surgical site. Continue  to wear abdominal binder. Wound Treatment Wound #2 - Abdomen - midline Wound Laterality: Midline Prim Dressing: Promogran Prisma Matrix, 4.34 (sq in) (silver collagen) (Generic) 1 x Per Day/30 Days ary Discharge Instructions: Moisten collagen with saline or hydrogel Secondary Dressing: ABD Pad, 8x10 (Generic) 1 x Per Day/30 Days Discharge Instructions: Apply over primary dressing as directed. Secondary Dressing: Bordered Gauze, 2x3.75 in (Generic) 1 x Per Day/30 Days Discharge Instructions: Apply over primary dressing as directed. Secondary Dressing: 1 x Per Day/30 Days Add-Ons: Skintegrity Hydrogel (Generic) 1 x Per Day/30 Days Electronic Signature(s) Signed: 05/03/2023 4:14:51 PM By: Geralyn Corwin DO Signed: 05/03/2023 5:38:35 PM By: Karie Schwalbe RN Raynald Kemp, Signed: 05/03/2023 5:38:35 PM By: Karie Schwalbe RN Suella Sharon [name] ([id number]) [phone].pdf Page 3 of 6 Entered By: Karie Schwalbe on 05/03/2023 14:57:41 -------------------------------------------------------------------------------- Problem List Details Patient Name: Date of Service: Sharon Sharon [name], Sharon Sharon [name]. 05/03/2023 2:00 PM Medical Record Number: [id number] Patient Account Number: [id number] Date of Birth/Sex: Treating RN: Jul 27, 1966 (57 y.o. F) Primary Care Provider: Benny Lennert Other Clinician: Referring Provider: Treating Provider/Extender: Skeet Latch in Treatment: 3 Active Problems ICD-10 Encounter Code Description Active Date MDM Diagnosis T81.31XD Disruption of  external operation (surgical) wound, not elsewhere classified, 04/12/2023 No Yes subsequent encounter S31.109S Unspecified open wound of abdominal wall, unspecified quadrant without 04/12/2023 No Yes penetration into peritoneal cavity, sequela E11.622 Type 2 diabetes mellitus with other skin ulcer 04/12/2023 No Yes Inactive Problems Resolved Problems Electronic Signature(s) Signed: 05/03/2023 4:14:51 PM By: Geralyn Corwin DO Entered By: Geralyn Corwin on 05/03/2023 14:51:29 -------------------------------------------------------------------------------- Progress Note Details Patient Name: Date of Service: Sharon Sharon [name], Sharon Sharon [name]. 05/03/2023 2:00 PM Medical Record Number: [id number] Patient Account Number: [id number] Date of Birth/Sex: Treating RN: 01/09/1966 (57 y.o. F) Primary Care Provider: Benny Lennert Other Clinician: Referring Provider: Treating Provider/Extender: Skeet Latch in Treatment: 3 Subjective Chief Complaint Information obtained from Patient 04/12/2023; patient is here for review of nonhealing wounds and an abdominoplasty incision on April 23/24 History of Present Illness (HPI) ADMISSION 04/12/2023 CARALYN, BECKET Sharon [name] ([id number]) [id number] Page 4 of 6 This is Sharon [name] 57 year old woman with type 2 diabetes who underwent an abdominoplasty on April 23/2024. She states that the wound was closed but dehisced in 3 areas perhaps 2 to 3 weeks after the surgery. She has been using ABD pads and abdominal binder. Her surgery was by Dr. Marya Amsler plastic surgery in [place]. Past medical history type 2 diabetes, hypertension 6/20; patient presents for follow-up. She has been using collagen to the wound beds without issues. The left side wound has healed. She has 2 small remaining wounds 1 to the abdomen and another to the right side. She has no issues or complaints today. 6/27; patient presents for follow-up. She has been using collagen to the wound beds. The  right lateral wound has healed. The anterior wound is very small. Patient History Information obtained from Patient. Family History Unknown History. Social History Never smoker, Caffeine Use - Moderate. Medical History Endocrine Patient has history of Type II Diabetes Hospitalization/Surgery History - Abdominoplasty (02/27/23);Excisional Breast surgery- Bilateral 1978. Objective Constitutional respirations regular, non-labored and within target range for patient.. Vitals Time Taken: 2:23 PM, Height: 66 in, Weight: 210 lbs, BMI: 33.9, Temperature: 98.2 F, Pulse: 90 bpm, Respiratory Rate: 18 breaths/min, Blood Pressure: 155/80 mmHg. Psychiatric pleasant and cooperative. General Notes: Epithelization to the previous wound site on the right lateral side. 1 small remaining wound to the anterior incision along the abdomen. Granulation tissue present. No  signs of infection. No drainage noted. Integumentary (Hair, Skin) Wound #1 status is Healed - Epithelialized. Original cause of wound was Surgical Injury. The date acquired was: 02/27/2023. The wound has been in treatment 3 weeks. The wound is located on the Right Abdomen - Lower Quadrant. The wound measures 0cm length x 0cm width x 0cm depth; 0cm^2 area and 0cm^3 volume. There is no tunneling or undermining noted. There is Sharon [name] none present amount of drainage noted. The wound margin is distinct with the outline attached to the wound base. There is no granulation within the wound bed. There is no necrotic tissue within the wound bed. The periwound skin appearance had no abnormalities noted for texture. The periwound skin appearance had no abnormalities noted for moisture. The periwound skin appearance had no abnormalities noted for color. Periwound temperature was noted as No Abnormality. Wound #2 status is Open. Original cause of wound was Gradually Appeared. The date acquired was: 02/27/2023. The wound has been in treatment 3 weeks. The wound is  located on the Midline Abdomen - midline. The wound measures 0.1cm length x 0.1cm width x 0.1cm depth; 0.008cm^2 area and 0.001cm^3 volume. There is Fat Layer (Subcutaneous Tissue) exposed. There is no tunneling or undermining noted. There is Sharon [name] small amount of serosanguineous drainage noted. The wound margin is distinct with the outline attached to the wound base. There is large (67-100%) red granulation within the wound bed. There is no necrotic tissue within the wound bed. The periwound skin appearance had no abnormalities noted for moisture. The periwound skin appearance had no abnormalities noted for color. The periwound skin appearance did not exhibit: Callus, Crepitus, Excoriation, Induration, Rash, Scarring. Periwound temperature was noted as No Abnormality. Assessment Active Problems ICD-10 Disruption of external operation (surgical) wound, not elsewhere classified, subsequent encounter Unspecified open wound of abdominal wall, unspecified quadrant without penetration into peritoneal cavity, sequela Type 2 diabetes mellitus with other skin ulcer Patient has done well with collagen. She has 1 small remaining wound to the anterior abdominal incision line. I recommended continuing collagen here. I recommended continuing to stay out of work for now until reevaluated in 1-2 weeks. 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 She knows to call with any questions or concerns. Plan AQUILAH, CUNDARI Sharon [name] ([id number]) [id number] Page 5 of 6 Follow-up Appointments: Return Appointment in 1 week. - Dr. Mikey Bussing Room 9 Other: - Patient to wait one more week to be out of work. Will re-evaluate next week when to return to work. Anesthetic: (In clinic) Topical Lidocaine 4% applied to wound bed - Used in clinic Bathing/ Shower/ Hygiene: May shower and wash wound with soap and water. - Change dressing after showering/bathing, if Sharon  desired. Additional Orders / Instructions: Other: - Please do not have waistband of pants/underpants over the abdomen surgical site. Continue to wear abdominal binder. WOUND #2: - Abdomen - midline Wound Laterality: Midline Prim Dressing: Promogran Prisma Matrix, 4.34 (sq in) (silver collagen) (Generic) 1 x Per Day/30 Days ary Discharge Instructions: Moisten collagen with saline or hydrogel Secondary Dressing: ABD Pad, 8x10 (Generic) 1 x Per Day/30 Days Discharge Instructions: Apply over primary dressing as directed. Secondary Dressing: Bordered Gauze, 2x3.75 in (Generic) 1 x Per Day/30 Days Discharge Instructions: Apply over primary dressing as directed. Secondary Dressing: 1 x Per Day/30 Days Add-Ons: Skintegrity Hydrogel (Generic) 1 x Per Day/30 Days 1. Collagen 2. Follow-up in 1-2 weeks Electronic Signature(s) Signed: 05/04/2023 2:14:44 PM By: Shawn Stall  RN, BSN Signed: 05/07/2023 10:26:47 AM By: Geralyn Corwin DO Previous Signature: 05/03/2023 4:14:51 PM Version By: Geralyn Corwin DO Entered By: Shawn Stall on 05/04/2023 14:13:16 -------------------------------------------------------------------------------- HxROS Details Patient Name: Date of Service: Sharon Sharon [name], Sharon Sharon [name]. 05/03/2023 2:00 PM Medical Record Number: [id number] Patient Account Number: [id number] Date of Birth/Sex: Treating RN: [birth year] (57 y.o. F) Primary Care Provider: Benny Lennert Other Clinician: Referring Provider: Treating Provider/Extender: Skeet Latch in Treatment: 3 Information Obtained From Patient Endocrine Medical History: Positive for: Type II Diabetes Time with diabetes: On Mounjaro 1 year Immunizations Pneumococcal Vaccine: Received Pneumococcal Vaccination: No Implantable Devices None Hospitalization / Surgery History Type of Hospitalization/Surgery Abdominoplasty (02/27/23);Excisional Breast surgery- Bilateral 1978 Family and Social History Unknown History:  Yes; Never smoker; Caffeine Use: Moderate; Financial Concerns: No; Food, Clothing or Shelter Needs: No; Support System Lacking: No; Transportation Concerns: No Electronic Signature(s) Signed: 05/03/2023 4:14:51 PM By: Darlen Round, Bobbe Sharon [name] ([id number]) [id number] Page 6 of 6 Entered By: Geralyn Corwin on 05/03/2023 14:52:24 -------------------------------------------------------------------------------- SuperBill Details Patient Name: Date of Service: Sharon Sharon [name], Sharon Sharon [name]. 05/03/2023 Medical Record Number: [id number] Patient Account Number: [id number] Date of Birth/Sex: Treating RN: 12-30-1965 (57 y.o. F) Primary Care Provider: Benny Lennert Other Clinician: Referring Provider: Treating Provider/Extender: Skeet Latch in Treatment: 3 Diagnosis Coding ICD-10 Codes Code Description T81.31XD Disruption of external operation (surgical) wound, not elsewhere classified, subsequent encounter S31.109S Unspecified open wound of abdominal wall, unspecified quadrant without penetration into peritoneal cavity, sequela E11.622 Type 2 diabetes mellitus with other skin ulcer Facility Procedures : CPT4 Code: [id number] Description: 99213 - WOUND CARE VISIT-LEV 3 EST PT Modifier: Quantity: 1 Physician Procedures : CPT4 Code Description Modifier [id number] 99213 - WC PHYS LEVEL 3 - EST PT ICD-10 Diagnosis Description T81.31XD Disruption of external operation (surgical) wound, not elsewhere classified, subsequent encounter S31.109S Unspecified open wound of abdominal  wall, unspecified quadrant without penetration into peritoneal c E11.622 Type 2 diabetes mellitus with other skin ulcer Quantity: 1 avity, sequela Electronic Signature(s) Signed: 05/03/2023 5:38:35 PM By: Karie Schwalbe RN Signed: 05/04/2023 11:46:38 AM By: Geralyn Corwin DO Previous Signature: 05/03/2023 4:14:51 PM Version By: Geralyn Corwin DO Entered By: Karie Schwalbe on  05/03/2023 17:37:31

## 2023-05-14 ENCOUNTER — Encounter (HOSPITAL_BASED_OUTPATIENT_CLINIC_OR_DEPARTMENT_OTHER): Payer: Federal, State, Local not specified - PPO | Attending: Internal Medicine | Admitting: Internal Medicine

## 2023-05-14 DIAGNOSIS — E11622 Type 2 diabetes mellitus with other skin ulcer: Secondary | ICD-10-CM | POA: Insufficient documentation

## 2023-05-14 DIAGNOSIS — S31109S Unspecified open wound of abdominal wall, unspecified quadrant without penetration into peritoneal cavity, sequela: Secondary | ICD-10-CM | POA: Diagnosis not present

## 2023-05-14 DIAGNOSIS — Y834 Other reconstructive surgery as the cause of abnormal reaction of the patient, or of later complication, without mention of misadventure at the time of the procedure: Secondary | ICD-10-CM | POA: Diagnosis not present

## 2023-05-14 DIAGNOSIS — T8131XD Disruption of external operation (surgical) wound, not elsewhere classified, subsequent encounter: Secondary | ICD-10-CM

## 2023-05-14 DIAGNOSIS — T8131XA Disruption of external operation (surgical) wound, not elsewhere classified, initial encounter: Secondary | ICD-10-CM | POA: Diagnosis not present

## 2023-05-14 NOTE — Progress Notes (Signed)
Sharon, NADOLNY [name] ([id number]) [id number] Page 1 of 6 Visit Report for 05/14/2023 Chief Complaint Document Details Patient Name: Date of Service: Sharon [name], SO NIA [name]. 05/14/2023 8:45 [name] Medical Record Number: [id number] Patient Account Number: [id number] Date of Birth/Sex: Treating RN: October 26, 1966 (57 y.o. F) Primary Care Provider: Benny Lennert Other Clinician: Referring Provider: Treating Provider/Extender: Skeet Latch in Treatment: 4 Information Obtained from: Patient Chief Complaint 04/12/2023; patient is here for review of nonhealing wounds and an abdominoplasty incision on April 23/24 Electronic Signature(s) Signed: 05/14/2023 12:25:26 PM By: Geralyn Corwin DO Entered By: Geralyn Corwin on 05/14/2023 10:06:54 -------------------------------------------------------------------------------- HPI Details Patient Name: Date of Service: Sharon [name], SO NIA [name]. 05/14/2023 8:45 [name] Medical Record Number: [id number] Patient Account Number: [id number] Date of Birth/Sex: Treating RN: 03-17-1966 (57 y.o. F) Primary Care Provider: Benny Lennert Other Clinician: Referring Provider: Treating Provider/Extender: Skeet Latch in Treatment: 4 History of Present Illness HPI Description: ADMISSION 04/12/2023 This is [name] 57 year old woman with type 2 diabetes who underwent an abdominoplasty on April 23/2024. She states that the wound was closed but dehisced in 3 areas perhaps 2 to 3 weeks after the surgery. She has been using ABD pads and abdominal binder. Her surgery was by Dr. Marya Amsler plastic surgery in [place]. Past medical history type 2 diabetes, hypertension 6/20; patient presents for follow-up. She has been using collagen to the wound beds without issues. The left side wound has healed. She has 2 small remaining wounds 1 to the abdomen and another to the right side. She has no issues or complaints today. 6/27; patient presents for follow-up.  She has been using collagen to the wound beds. The right lateral wound has healed. The anterior wound is very small. 7/8; patient presents for follow-up. She has been using collagen to the anterior abdominal wound bed. Her wound is healed. He has no issues or complaints today. Electronic Signature(s) Signed: 05/14/2023 12:25:26 PM By: Geralyn Corwin DO Entered By: Geralyn Corwin on 05/14/2023 10:08:06 Deliah Goody [name] ([id number]) [id number] Page 2 of 6 -------------------------------------------------------------------------------- Physical Exam Details Patient Name: Date of Service: Sharon [name], SO NIA [name]. 05/14/2023 8:45 [name] Medical Record Number: [id number] Patient Account Number: [id number] Date of Birth/Sex: Treating RN: 1966/03/16 (57 y.o. F) Primary Care Provider: Benny Lennert Other Clinician: Referring Provider: Treating Provider/Extender: Skeet Latch in Treatment: 4 Constitutional respirations regular, non-labored and within target range for patient.. Cardiovascular 2+ dorsalis pedis/posterior tibialis pulses. Psychiatric pleasant and cooperative. Notes Epithelization to all previous wound sites along the previous incision line on the abdomen. No signs of infection. No drainage noted. Electronic Signature(s) Signed: 05/14/2023 12:25:26 PM By: Geralyn Corwin DO Entered By: Geralyn Corwin on 05/14/2023 10:08:58 -------------------------------------------------------------------------------- Physician Orders Details Patient Name: Date of Service: Sharon [name], SO NIA [name]. 05/14/2023 8:45 [name] Medical Record Number: [id number] Patient Account Number: [id number] Date of Birth/Sex: Treating RN: 12/28/1965 (57 y.o. Arta Silence Primary Care Provider: Benny Lennert Other Clinician: Referring Provider: Treating Provider/Extender: Skeet Latch in Treatment: 4 Verbal / Phone Orders: No Diagnosis Coding ICD-10  Coding Code Description T81.31XD Disruption of external operation (surgical) wound, not elsewhere classified, subsequent encounter S31.109S Unspecified open wound of abdominal wall, unspecified quadrant without penetration into peritoneal cavity, sequela E11.622 Type 2 diabetes mellitus with other skin ulcer Discharge From [facility] Discharge from Wound Care Center - Call if any future wound care needs. May return to work with the following restrictions for next x2 weeks  work three days [name] week and no more than lifting 10 lbs. If you need any futher restrictions thus after return to your primary care provider. Continue to wear your abdminal binder x2-4 weeks. Electronic Signature(s) Signed: 05/14/2023 12:25:26 PM By: Geralyn Corwin DO Entered By: Geralyn Corwin on 05/14/2023 10:09:05 Deliah Goody [name] ([id number]) [id number] Page 3 of 6 -------------------------------------------------------------------------------- Problem List Details Patient Name: Date of Service: Sharon [name], SO NIA [name]. 05/14/2023 8:45 [name] Medical Record Number: [id number] Patient Account Number: [id number] Date of Birth/Sex: Treating RN: Nov 21, 1965 (57 y.o. Arta Silence Primary Care Provider: Benny Lennert Other Clinician: Referring Provider: Treating Provider/Extender: Skeet Latch in Treatment: 4 Active Problems ICD-10 Encounter Code Description Active Date MDM Diagnosis T81.31XD Disruption of external operation (surgical) wound, not elsewhere classified, 04/12/2023 No Yes subsequent encounter S31.109S Unspecified open wound of abdominal wall, unspecified quadrant without 04/12/2023 No Yes penetration into peritoneal cavity, sequela E11.622 Type 2 diabetes mellitus with other skin ulcer 04/12/2023 No Yes Inactive Problems Resolved Problems Electronic Signature(s) Signed: 05/14/2023 12:25:26 PM By: Geralyn Corwin DO Entered By: Geralyn Corwin on 05/14/2023  10:06:41 -------------------------------------------------------------------------------- Progress Note Details Patient Name: Date of Service: Sharon [name], SO NIA [name]. 05/14/2023 8:45 [name] Medical Record Number: [id number] Patient Account Number: [id number] Date of Birth/Sex: Treating RN: 14-Jun-1966 (58 y.o. F) Primary Care Provider: Benny Lennert Other Clinician: Referring Provider: Treating Provider/Extender: Skeet Latch in Treatment: 4 Subjective Chief Complaint Information obtained from Patient 04/12/2023; patient is here for review of nonhealing wounds and an abdominoplasty incision on April 23/24 History of Present Illness (HPI) ADMISSION 04/12/2023 This is [name] 57 year old woman with type 2 diabetes who underwent an abdominoplasty on April 23/2024. She states that the wound was closed but dehisced in 3 areas perhaps 2 to 3 weeks after the surgery. She has been using ABD pads and abdominal binder. Her surgery was by Dr. Marya Amsler plastic surgery in [place]. Past medical history type 2 diabetes, hypertension Grossi, Dmya [name] ([id number]) [id number] Page 4 of 6 6/20; patient presents for follow-up. She has been using collagen to the wound beds without issues. The left side wound has healed. She has 2 small remaining wounds 1 to the abdomen and another to the right side. She has no issues or complaints today. 6/27; patient presents for follow-up. She has been using collagen to the wound beds. The right lateral wound has healed. The anterior wound is very small. 7/8; patient presents for follow-up. She has been using collagen to the anterior abdominal wound bed. Her wound is healed. He has no issues or complaints today. Patient History Information obtained from Patient. Family History Unknown History. Social History Never smoker, Caffeine Use - Moderate. Medical History Endocrine Patient has history of Type II Diabetes Hospitalization/Surgery History -  Abdominoplasty (02/27/23);Excisional Breast surgery- Bilateral 1978. Objective Constitutional respirations regular, non-labored and within target range for patient.. Vitals Time Taken: 9:17 AM, Height: 66 in, Weight: 210 lbs, BMI: 33.9, Temperature: 97.7 F, Pulse: 60 bpm, Respiratory Rate: 18 breaths/min, Blood Pressure: 134/84 mmHg. Cardiovascular 2+ dorsalis pedis/posterior tibialis pulses. Psychiatric pleasant and cooperative. General Notes: Epithelization to all previous wound sites along the previous incision line on the abdomen. No signs of infection. No drainage noted. Integumentary (Hair, Skin) Wound #2 status is Healed - Epithelialized. Original cause of wound was Gradually Appeared. The date acquired was: 02/27/2023. The wound has been in treatment 4 weeks. The wound is located on the Midline Abdomen - midline. The wound measures 0cm length  x 0cm width x 0cm depth; 0cm^2 area and 0cm^3 volume. There is no tunneling or undermining noted. There is [name] small amount of serosanguineous drainage noted. The wound margin is distinct with the outline attached to the wound base. There is no granulation within the wound bed. There is no necrotic tissue within the wound bed. The periwound skin appearance had no abnormalities noted for moisture. The periwound skin appearance had no abnormalities noted for color. The periwound skin appearance did not exhibit: Callus, Crepitus, Excoriation, Induration, Rash, Scarring. Periwound temperature was noted as No Abnormality. Assessment Active Problems ICD-10 Disruption of external operation (surgical) wound, not elsewhere classified, subsequent encounter Unspecified open wound of abdominal wall, unspecified quadrant without penetration into peritoneal cavity, sequela Type 2 diabetes mellitus with other skin ulcer Patient has done well with collagen. Her wounds are healed. 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 At this time  I recommended [name] restriction of not carrying over 10 pounds. She also requested not working more than 3 days [name] week. We gave her these restrictions for the next 2 weeks. She knows to talk with her primary care physician if she wants an extension. She may follow-up in the wound care as needed. Plan Discharge From [facility] Services: Discharge from Wound Care Center - Call if any future wound care needs. May return to work with the following restrictions for next x2 weeks work three days [name] week and no more than lifting 10 lbs. If you need any futher restrictions thus after return to your primary care provider. Continue to wear your abdminal binder x2-4 weeks. ESABELLE, SMACK [name] ([id number]) [id number] Page 5 of 6 1. Discharge from clinic due to closed wound 2. Follow-up as needed 3. Continue abdominal binder Electronic Signature(s) Signed: 05/14/2023 12:25:26 PM By: Geralyn Corwin DO Entered By: Geralyn Corwin on 05/14/2023 10:13:59 -------------------------------------------------------------------------------- HxROS Details Patient Name: Date of Service: Sharon [name], SO NIA [name]. 05/14/2023 8:45 [name] Medical Record Number: [id number] Patient Account Number: [id number] Date of Birth/Sex: Treating RN: 08-Apr-1966 (57 y.o. F) Primary Care Provider: Benny Lennert Other Clinician: Referring Provider: Treating Provider/Extender: Skeet Latch in Treatment: 4 Information Obtained From Patient Endocrine Medical History: Positive for: Type II Diabetes Time with diabetes: On Mounjaro 1 year Immunizations Pneumococcal Vaccine: Received Pneumococcal Vaccination: No Implantable Devices None Hospitalization / Surgery History Type of Hospitalization/Surgery Abdominoplasty (02/27/23);Excisional Breast surgery- Bilateral 1978 Family and Social History Unknown History: Yes; Never smoker; Caffeine Use: Moderate; Financial Concerns: No; Food, Clothing or Shelter Needs:  No; Support System Lacking: No; Transportation Concerns: No Electronic Signature(s) Signed: 05/14/2023 12:25:26 PM By: Geralyn Corwin DO Entered By: Geralyn Corwin on 05/14/2023 10:08:12 -------------------------------------------------------------------------------- SuperBill Details Patient Name: Date of Service: Sharon [name], SO NIA [name]. 05/14/2023 Medical Record Number: [id number] Patient Account Number: [id number] Date of Birth/Sex: Treating RN: 12-13-1965 (57 y.o. Arta Silence Primary Care Provider: Benny Lennert Other Clinician: Referring Provider: Treating Provider/Extender: Skeet Latch in Treatment: 4 ZULEIDY, SANER [name] ([id number]) [id number] Page 6 of 6 Diagnosis Coding ICD-10 Codes Code Description T81.31XD Disruption of external operation (surgical) wound, not elsewhere classified, subsequent encounter S31.109S Unspecified open wound of abdominal wall, unspecified quadrant without penetration into peritoneal cavity, sequela E11.622 Type 2 diabetes mellitus with other skin ulcer Facility Procedures : CPT4 Code: [id number] Description: 99213 - WOUND CARE VISIT-LEV 3 EST PT Modifier: Quantity: 1 Physician Procedures : CPT4 Code Description Modifier [id number] 99213 - WC PHYS LEVEL 3 - EST  PT ICD-10 Diagnosis Description T81.31XD Disruption of external operation (surgical) wound, not elsewhere classified, subsequent encounter S31.109S Unspecified open wound of abdominal  wall, unspecified quadrant without penetration into peritoneal c E11.622 Type 2 diabetes mellitus with other skin ulcer Quantity: 1 avity, sequela Electronic Signature(s) Signed: 05/14/2023 12:25:26 PM By: Geralyn Corwin DO Entered By: Geralyn Corwin on 05/14/2023 10:14:24

## 2023-05-15 NOTE — Progress Notes (Signed)
GRACELEE, STEMMLER [name] ([id number]) [id number] Page 1 of 7 Visit Report for 05/14/2023 Arrival Information Details Patient Name: Date of Service: Sharon [name], Sharon NIA [name]. 05/14/2023 8:45 [name] Medical Record Number: [id number] Patient Account Number: [id number] Date of Birth/Sex: Treating RN: [birth year] (57 y.o. F) Primary Care Lomax Poehler: Benny Lennert Other Clinician: Referring Newt Levingston: Treating Yancy Knoble/Extender: Skeet Latch in Treatment: 4 Visit Information History Since Last Visit Added or deleted any medications: No Patient Arrived: Ambulatory Any new allergies or adverse reactions: No Arrival Time: 09:15 Had [name] fall or experienced change in No Accompanied By: self activities of daily living that may affect Transfer Assistance: None risk of falls: Patient Identification Verified: Yes Signs or symptoms of abuse/neglect since last visito No Secondary Verification Process Completed: Yes Hospitalized since last visit: No Implantable device outside of the clinic excluding No cellular tissue based products placed in the center since last visit: Has Dressing in Place as Prescribed: Yes Pain Present Now: No Electronic Signature(s) Signed: 05/15/2023 4:04:59 PM By: Thayer Dallas Entered By: Thayer Dallas on 05/14/2023 09:17:04 -------------------------------------------------------------------------------- Clinic Level of Care Assessment Details Patient Name: Date of Service: Sharon [name], Sharon NIA [name]. 05/14/2023 8:45 [name] Medical Record Number: [id number] Patient Account Number: [id number] Date of Birth/Sex: Treating RN: [birth year] (57 y.o. Sharon [name] Primary Care Carlen Fils: Benny Lennert Other Clinician: Referring Carmencita Cusic: Treating Kyani Simkin/Extender: Skeet Latch in Treatment: 4 Clinic Level of Care Assessment Items TOOL 4 Quantity Score X- 1 0 Use when only an EandM is performed on FOLLOW-UP visit ASSESSMENTS - Nursing  Assessment / Reassessment X- 1 10 Reassessment of Co-morbidities (includes updates in patient status) X- 1 5 Reassessment of Adherence to Treatment Plan ASSESSMENTS - Wound and Skin [name] ssessment / Reassessment X - Simple Wound Assessment / Reassessment - one wound 1 5 []  - 0 Complex Wound Assessment / Reassessment - multiple wounds []  - 0 Dermatologic / Skin Assessment (not related to wound area) ASSESSMENTS - Focused Assessment []  - 0 Circumferential Edema Measurements - multi extremities []  - 0 Nutritional Assessment / Counseling / Intervention Sharon [name], Sharon [name] ([id number]) [id number] Page 2 of 7 []  - 0 Lower Extremity Assessment (monofilament, tuning fork, pulses) []  - 0 Peripheral Arterial Disease Assessment (using hand held doppler) ASSESSMENTS - Ostomy and/or Continence Assessment and Care []  - 0 Incontinence Assessment and Management []  - 0 Ostomy Care Assessment and Management (repouching, etc.) PROCESS - Coordination of Care X - Simple Patient / Family Education for ongoing care 1 15 []  - 0 Complex (extensive) Patient / Family Education for ongoing care X- 1 10 Staff obtains Chiropractor, Records, T Results / Process Orders est []  - 0 Staff telephones HHA, Nursing Homes / Clarify orders / etc []  - 0 Routine Transfer to another Facility (non-emergent condition) []  - 0 Routine Hospital Admission (non-emergent condition) []  - 0 New Admissions / Manufacturing engineer / Ordering NPWT Apligraf, etc. , []  - 0 Emergency Hospital Admission (emergent condition) X- 1 10 Simple Discharge Coordination []  - 0 Complex (extensive) Discharge Coordination PROCESS - Special Needs []  - 0 Pediatric / Minor Patient Management []  - 0 Isolation Patient Management []  - 0 Hearing / Language / Visual special needs []  - 0 Assessment of Community assistance (transportation, D/C planning, etc.) []  - 0 Additional assistance / Altered mentation []  -  0 Support Surface(s) Assessment (bed, cushion, seat, etc.) INTERVENTIONS - Wound Cleansing / Measurement X - Simple Wound Cleansing - one wound 1 5 []  - 0 Complex  Wound Cleansing - multiple wounds X- 1 5 Wound Imaging (photographs - any number of wounds) []  - 0 Wound Tracing (instead of photographs) X- 1 5 Simple Wound Measurement - one wound []  - 0 Complex Wound Measurement - multiple wounds INTERVENTIONS - Wound Dressings X - Small Wound Dressing one or multiple wounds 1 10 []  - 0 Medium Wound Dressing one or multiple wounds []  - 0 Large Wound Dressing one or multiple wounds []  - 0 Application of Medications - topical []  - 0 Application of Medications - injection INTERVENTIONS - Miscellaneous []  - 0 External ear exam []  - 0 Specimen Collection (cultures, biopsies, blood, body fluids, etc.) []  - 0 Specimen(s) / Culture(s) sent or taken to Lab for analysis []  - 0 Patient Transfer (multiple staff / Nurse, adult / Similar devices) []  - 0 Simple Staple / Suture removal (25 or less) []  - 0 Complex Staple / Suture removal (26 or more) []  - 0 Hypo / Hyperglycemic Management (close monitor of Blood Glucose) Sharon [name], Sharon [name] ([id number]) [id number] Page 3 of 7 []  - 0 Ankle / Brachial Index (ABI) - do not check if billed separately X- 1 5 Vital Signs Has the patient been seen at the hospital within the last three years: Yes Total Score: 85 Level Of Care: New/Established - Level 3 Electronic Signature(s) Signed: 05/14/2023 5:30:48 PM By: Shawn Stall RN, BSN Entered By: Shawn Stall on 05/14/2023 10:03:48 -------------------------------------------------------------------------------- Encounter Discharge Information Details Patient Name: Date of Service: Sharon [name], Sharon NIA [name]. 05/14/2023 8:45 [name] Medical Record Number: [id number] Patient Account Number: [id number] Date of Birth/Sex: Treating RN: [birth year] (57 y.o. Sharon [name] Primary Care Ariannah Arenson:  Benny Lennert Other Clinician: Referring Pasha Broad: Treating Marguerite Barba/Extender: Skeet Latch in Treatment: 4 Encounter Discharge Information Items Discharge Condition: Stable Ambulatory Status: Ambulatory Discharge Destination: Home Transportation: Private Auto Accompanied By: self Schedule Follow-up Appointment: No Clinical Summary of Care: Electronic Signature(s) Signed: 05/14/2023 5:30:48 PM By: Shawn Stall RN, BSN Entered By: Shawn Stall on 05/14/2023 10:04:07 -------------------------------------------------------------------------------- Lower Extremity Assessment Details Patient Name: Date of Service: Sharon [name], Sharon NIA [name]. 05/14/2023 8:45 [name] Medical Record Number: [id number] Patient Account Number: [id number] Date of Birth/Sex: Treating RN: June 05, 1966 (57 y.o. F) Primary Care Aaima Gaddie: Benny Lennert Other Clinician: Referring Jasemine Nawaz: Treating Jennifier Smitherman/Extender: Skeet Latch in Treatment: 4 Electronic Signature(s) Signed: 05/15/2023 4:04:59 PM By: Thayer Dallas Entered By: Thayer Dallas on 05/14/2023 09:19:36 -------------------------------------------------------------------------------- Multi Wound Chart Details Patient Name: Date of Service: Sharon [name], Sharon NIA [name]. 05/14/2023 8:45 [name] Medical Record Number: [id number] Patient Account Number: [id number] Sharon [name], Sharon [name] ([id number]) [id number] Page 4 of 7 Date of Birth/Sex: Treating RN: September 12, 1966 (57 y.o. F) Primary Care Candiss Galeana: Other Clinician: Benny Lennert Referring Bryanda Mikel: Treating Reshma Hoey/Extender: Skeet Latch in Treatment: 4 Vital Signs Height(in): 66 Pulse(bpm): 60 Weight(lbs): 210 Blood Pressure(mmHg): 134/84 Body Mass Index(BMI): 33.9 Temperature(F): 97.7 Respiratory Rate(breaths/min): 18 [2:Photos:] [N/[name]:N/[name]] Midline Abdomen - midline N/[name] N/[name] Wound Location: Gradually Appeared N/[name] N/[name] Wounding Event: Dehisced  Wound N/[name] N/[name] Primary Etiology: Type II Diabetes N/[name] N/[name] Comorbid History: 02/27/2023 N/[name] N/[name] Date Acquired: 4 N/[name] N/[name] Weeks of Treatment: Healed - Epithelialized N/[name] N/[name] Wound Status: No N/[name] N/[name] Wound Recurrence: Yes N/[name] N/[name] Clustered Wound: 1 N/[name] N/[name] Clustered Quantity: 0x0x0 N/[name] N/[name] Measurements L x W x D (cm) 0 N/[name] N/[name] [name] (cm) : rea 0 N/[name] N/[name] Volume (cm) : 100.00% N/[name] N/[name] % Reduction in [name] rea: 100.00% N/[name] N/[name] %  Reduction in Volume: Full Thickness Without Exposed N/[name] N/[name] Classification: Support Structures Small N/[name] N/[name] Exudate Amount: Serosanguineous N/[name] N/[name] Exudate Type: red, brown N/[name] N/[name] Exudate Color: Distinct, outline attached N/[name] N/[name] Wound Margin: None Present (0%) N/[name] N/[name] Granulation Amount: None Present (0%) N/[name] N/[name] Necrotic Amount: Fascia: No N/[name] N/[name] Exposed Structures: Fat Layer (Subcutaneous Tissue): No Tendon: No Muscle: No Joint: No Bone: No Large (67-100%) N/[name] N/[name] Epithelialization: Excoriation: No N/[name] N/[name] Periwound Skin Texture: Induration: No Callus: No Crepitus: No Rash: No Scarring: No Maceration: No N/[name] N/[name] Periwound Skin Moisture: Dry/Scaly: No Atrophie Blanche: No N/[name] N/[name] Periwound Skin Color: Cyanosis: No Ecchymosis: No Erythema: No Hemosiderin Staining: No Mottled: No Pallor: No Rubor: No No Abnormality N/[name] N/[name] Temperature: Treatment Notes Electronic Signature(s) Signed: 05/14/2023 12:25:26 PM By: Geralyn Corwin DO Entered By: Geralyn Corwin on 05/14/2023 10:06:46 Sharon [name] ([id number]) [id number] Page 5 of 7 -------------------------------------------------------------------------------- Multi-Disciplinary Care Plan Details Patient Name: Date of Service: Sharon [name], Sharon NIA [name]. 05/14/2023 8:45 [name] Medical Record Number: [id number] Patient Account Number: [id number] Date of Birth/Sex: Treating RN: 1966/07/13 (57 y.o. Sharon [name] Primary Care Debora Stockdale: Benny Lennert Other  Clinician: Referring Kahle Mcqueen: Treating Kolleen Ochsner/Extender: Skeet Latch in Treatment: 4 Active Inactive Electronic Signature(s) Signed: 05/14/2023 5:30:48 PM By: Shawn Stall RN, BSN Entered By: Shawn Stall on 05/14/2023 09:59:01 -------------------------------------------------------------------------------- Pain Assessment Details Patient Name: Date of Service: Sharon [name], Sharon NIA [name]. 05/14/2023 8:45 [name] Medical Record Number: [id number] Patient Account Number: [id number] Date of Birth/Sex: Treating RN: [birth year] (57 y.o. F) Primary Care Merve Hotard: Benny Lennert Other Clinician: Referring Shanti Eichel: Treating Anaissa Macfadden/Extender: Skeet Latch in Treatment: 4 Active Problems Location of Pain Severity and Description of Pain Patient Has Paino No Site Locations Pain Management and Medication Current Pain Management: Electronic Signature(s) Signed: 05/15/2023 4:04:59 PM By: Thayer Dallas Entered By: Thayer Dallas on 05/14/2023 09:19:27 Sharon [name] ([id number]) [id number] Page 6 of 7 -------------------------------------------------------------------------------- Patient/Caregiver Education Details Patient Name: Date of Service: Sharon [name], Sharon NIA [name]. 7/8/2024andnbsp8:45 [name] Medical Record Number: [id number] Patient Account Number: [id number] Date of Birth/Gender: Treating RN: August 17, 1966 (57 y.o. Sharon [name] Primary Care Physician: Benny Lennert Other Clinician: Referring Physician: Treating Physician/Extender: Skeet Latch in Treatment: 4 Education Assessment Education Provided To: Patient Education Topics Provided Wound/Skin Impairment: Handouts: Caring for Your Ulcer Methods: Explain/Verbal Responses: Reinforcements needed Electronic Signature(s) Signed: 05/14/2023 5:30:48 PM By: Shawn Stall RN, BSN Entered By: Shawn Stall on 05/14/2023  09:51:25 -------------------------------------------------------------------------------- Wound Assessment Details Patient Name: Date of Service: Sharon [name], Sharon NIA [name]. 05/14/2023 8:45 [name] Medical Record Number: [id number] Patient Account Number: [id number] Date of Birth/Sex: Treating RN: 1966-01-07 (57 y.o. Sharon [name] Primary Care Satoru Milich: Benny Lennert Other Clinician: Referring Dera Vanaken: Treating Anis Degidio/Extender: Skeet Latch in Treatment: 4 Wound Status Wound Number: 2 Primary Etiology: Dehisced Wound Wound Location: Midline Abdomen - midline Wound Status: Healed - Epithelialized Wounding Event: Gradually Appeared Comorbid History: Type II Diabetes Date Acquired: 02/27/2023 Weeks Of Treatment: 4 Clustered Wound: Yes Photos Wound Measurements Length: (cm) Sharon [name], Sharon [name] ([id number]) Width: (cm) Depth: (cm) Clustered Quantity: Area: (cm) Volume: (cm) 0 % Reduction in Area: 100% [id number] Page 7 of 7 0 % Reduction in Volume: 100% 0 Epithelialization: Large (67-100%) 1 Tunneling: No 0 Undermining: No 0 Wound Description Classification: Full Thickness Without Exposed Support Structures Wound Margin: Distinct, outline attached Exudate Amount: Small Exudate Type: Serosanguineous Exudate Color: red, brown Foul Odor After Cleansing:  No Slough/Fibrino Yes Wound Bed Granulation Amount: None Present (0%) Exposed Structure Necrotic Amount: None Present (0%) Fascia Exposed: No Fat Layer (Subcutaneous Tissue) Exposed: No Tendon Exposed: No Muscle Exposed: No Joint Exposed: No Bone Exposed: No Periwound Skin Texture Texture Color No Abnormalities Noted: No No Abnormalities Noted: Yes Callus: No Temperature / Pain Crepitus: No Temperature: No Abnormality Excoriation: No Induration: No Rash: No Scarring: No Moisture No Abnormalities Noted: Yes Electronic Signature(s) Signed: 05/14/2023 5:30:48 PM By: Shawn Stall  RN, BSN Entered By: Shawn Stall on 05/14/2023 09:58:49 -------------------------------------------------------------------------------- Vitals Details Patient Name: Date of Service: Sharon [name], Sharon NIA [name]. 05/14/2023 8:45 [name] Medical Record Number: [id number] Patient Account Number: [id number] Date of Birth/Sex: Treating RN: 28-Jan-1966 (57 y.o. F) Primary Care Dequavion Follette: Benny Lennert Other Clinician: Referring Kataya Guimont: Treating Paiden Caraveo/Extender: Skeet Latch in Treatment: 4 Vital Signs Time Taken: 09:17 Temperature (F): 97.7 Height (in): 66 Pulse (bpm): 60 Weight (lbs): 210 Respiratory Rate (breaths/min): 18 Body Mass Index (BMI): 33.9 Blood Pressure (mmHg): 134/84 Reference Range: 80 - 120 mg / dl Electronic Signature(s) Signed: 05/15/2023 4:04:59 PM By: Thayer Dallas Entered By: Thayer Dallas on 05/14/2023 09:19:17

## 2023-05-16 ENCOUNTER — Ambulatory Visit
Admission: EM | Admit: 2023-05-16 | Discharge: 2023-05-16 | Disposition: A | Discharge status: Home / Self Care | Payer: Federal, State, Local not specified - PPO | Attending: Internal Medicine | Admitting: Internal Medicine

## 2023-05-16 DIAGNOSIS — H6121 Impacted cerumen, right ear: Secondary | ICD-10-CM | POA: Diagnosis not present

## 2023-05-16 MED ORDER — FLUTICASONE PROPIONATE 50 MCG/ACT NA SUSP: 1.0000 | Freq: Every day | NASAL | 0 refills | Status: DC

## 2023-05-16 NOTE — ED Provider Notes (Signed)
[facility]    CSN: [id number] Arrival date & time: 05/16/23  1718      History   Chief Complaint Chief Complaint  Patient presents with   Ear Problem    HPI Sharon [name] is a 57 y.o. female.   Patient presents to urgent care for evaluation of right ear pain that started 1 week ago. She has decreased hearing from the right ear and hears popping to the right ear with eating/jaw movement. Denies nasal congestion, fever, chills, sore throat, and headache. No dizziness, neck pain, or jaw pain. She has been using debrox for the last 5 days without much relief of symptoms.      Past Medical History:  Diagnosis Date   Allergy    Hypertension    Seasonal allergies     Patient Active Problem List   Diagnosis Date Noted   Acute pain of right shoulder 02/21/2018   Right shoulder tendinitis 02/21/2018   Musculoskeletal pain 02/21/2018   Muscle spasm 02/21/2018   HSV (herpes simplex virus) infection 01/26/2018   Diverticulosis 06/21/2014   Fibroid, uterine 06/21/2014   GERD 08/06/2009   MORBID OBESITY 05/26/2008   Essential hypertension 02/19/2007    Past Surgical History:  Procedure Laterality Date   ABDOMINOPLASTY     BREAST EXCISIONAL BIOPSY Bilateral 1978   BREAST SURGERY      OB History   No obstetric history on file.      Home Medications    Prior to Admission medications   Medication Sig Start Date End Date Taking? Authorizing Provider  albuterol (VENTOLIN HFA) 108 (90 Base) MCG/ACT inhaler Inhale 2 puffs into the lungs every 6 (six) hours as needed for wheezing or shortness of breath (Cough). 11/14/22   Theadora Rama Scales, PA-C  azelastine (OPTIVAR) 0.05 % ophthalmic solution Place 1 drop into both eyes 2 (two) times daily. As needed for allergy symptoms 03/21/19   Janeece Agee, NP  cephALEXin (KEFLEX) 500 MG capsule Take 1 capsule (500 mg total) by mouth 3 (three) times daily. 03/26/23   Wallis Bamberg, PA-C  cetirizine (ZYRTEC ALLERGY) 10 MG  tablet Take 1 tablet (10 mg total) by mouth daily. 03/26/23   Wallis Bamberg, PA-C  dicyclomine (BENTYL) 20 MG tablet Take 1 tablet (20 mg total) by mouth 2 (two) times daily. 06/20/20   Joy, Shawn C, PA-C  fexofenadine (ALLEGRA) 180 MG tablet Take 1 tablet (180 mg total) by mouth daily. 08/14/22   Wallis Bamberg, PA-C  fluticasone (FLONASE) 50 MCG/ACT nasal spray Place 1 spray into both nostrils daily. 05/16/23   Carlisle Beers, FNP  guaifenesin (HUMIBID E) 400 MG TABS tablet Take 1 tablet 3 times daily as needed for chest congestion and cough 11/09/22   Theadora Rama Scales, PA-C  lisinopril-hydrochlorothiazide (ZESTORETIC) 20-25 MG tablet Take 1 tablet by mouth daily. 03/21/19   Janeece Agee, NP  montelukast (SINGULAIR) 10 MG tablet Take 1 tablet (10 mg total) by mouth at bedtime. 01/26/18   Weber, Dema Severin, PA-C  MOUNJARO 2.5 MG/0.5ML Pen Inject into the skin.    [provider]  ondansetron (ZOFRAN ODT) 4 MG disintegrating tablet Take 1 tablet (4 mg total) by mouth every 8 (eight) hours as needed for nausea or vomiting. 06/20/20   Joy, Shawn C, PA-C  phentermine 30 MG capsule Take by mouth daily. 02/27/19   [provider]  promethazine-dextromethorphan (PROMETHAZINE-DM) 6.25-15 MG/5ML syrup Take 2.5 mLs by mouth 3 (three) times daily as needed for cough. 03/26/23  Wallis Bamberg, PA-C  pseudoephedrine (SUDAFED) 60 MG tablet Take 1 tablet (60 mg total) by mouth every 8 (eight) hours as needed for congestion. 03/26/23   Wallis Bamberg, PA-C  triamcinolone cream (KENALOG) 0.5 % Apply 1 application topically 2 (two) times daily. Patient not taking: Reported on 04/18/2019 03/05/18   Valarie Cones, Dema Severin, PA-C  valACYclovir (VALTREX) 500 MG tablet TAKE 1 TABLET BY MOUTH 2 TIMES DIALY FOR 3 DAYS AS NEEDED OUTBREAK 03/21/19   Janeece Agee, NP    Family History Family History  Problem Relation Age of Onset   Diabetes Mother    Hypertension Father    Hypertension Sister    Breast cancer Maternal  Aunt     Social History Social History   Tobacco Use   Smoking status: Never   Smokeless tobacco: Never  Vaping Use   Vaping Use: Never used  Substance Use Topics   Alcohol use: No    Alcohol/week: 0.0 standard drinks of alcohol   Drug use: No     Allergies   Bee pollen, Codeine, Gramineae pollens, Pollen extract, and Metformin and related   Review of Systems Review of Systems Per HPI  Physical Exam Triage Vital Signs ED Triage Vitals  Enc Vitals Group     BP 05/16/23 1846 126/80     Pulse Rate 05/16/23 1846 65     Resp 05/16/23 1846 16     Temp 05/16/23 1846 99 F (37.2 C)     Temp Source 05/16/23 1846 Oral     SpO2 05/16/23 1846 94 %     Weight --      Height --      Head Circumference --      Peak Flow --      Pain Score 05/16/23 1850 6     Pain Loc --      Pain Edu? --      Excl. in GC? --    No data found.  Updated Vital Signs BP 126/80 (BP Location: Right Arm)   Pulse 65   Temp 99 F (37.2 C) (Oral)   Resp 16   LMP 03/01/2015   SpO2 94%   Visual Acuity Right Eye Distance:   Left Eye Distance:   Bilateral Distance:    Right Eye Near:   Left Eye Near:    Bilateral Near:     Physical Exam Vitals and nursing note reviewed.  Constitutional:      Appearance: She is not ill-appearing or toxic-appearing.  HENT:     Head: Normocephalic and atraumatic.     Right Ear: Hearing and external ear normal. There is impacted cerumen.     Left Ear: Hearing, tympanic membrane, ear canal and external ear normal.     Nose: Nose normal.     Mouth/Throat:     Lips: Pink.  Eyes:     General: Lids are normal. Vision grossly intact. Gaze aligned appropriately.     Extraocular Movements: Extraocular movements intact.     Conjunctiva/sclera: Conjunctivae normal.  Pulmonary:     Effort: Pulmonary effort is normal.  Musculoskeletal:     Cervical back: Neck supple.  Skin:    General: Skin is warm and dry.     Capillary Refill: Capillary refill takes less  than 2 seconds.     Findings: No rash.  Neurological:     General: No focal deficit present.     Mental Status: She is alert and oriented to person, place, and time. Mental status  is at baseline.     Cranial Nerves: No dysarthria or facial asymmetry.  Psychiatric:        Mood and Affect: Mood normal.        Speech: Speech normal.        Behavior: Behavior normal.        Thought Content: Thought content normal.        Judgment: Judgment normal.      UC Treatments / Results  Labs (all labs ordered are listed, but only abnormal results are displayed) Labs Reviewed - No data to display  EKG   Radiology No results found.  Procedures Procedures (including critical care time)  Medications Ordered in UC Medications - No data to display  Initial Impression / Assessment and Plan / UC Course  I have reviewed the triage vital signs and the nursing notes.  Pertinent labs & imaging results that were available during my care of the patient were reviewed by me and considered in my medical decision making (see chart for details).   1.  Impacted cerumen of right ear Right ear(s) cleaned with ear lavage to remove ear wax impactions bilaterally by nursing staff. Reassessment shows normal TM. Patient may use debrox ear drops at home as needed for wax removal and has been advised to avoid using Q-tips.   Discussed red flag signs and symptoms of worsening condition,when to call the PCP office, return to urgent care, and when to seek higher level of care in the emergency department. Counseled patient regarding appropriate use of medications and potential side effects for all medications recommended or prescribed today. Patient verbalizes understanding and agreement with plan. Discharged in stable condition.     Final Clinical Impressions(s) / UC Diagnoses   Final diagnoses:  Impacted cerumen of right ear     Discharge Instructions      We flushed out your ears today.  Pick up Flonase  and use this daily.  Continue to avoid using Q-tips to the ears as this can cause worsening wax impaction. Use over-the-counter Debrox eardrops as needed to remove earwax.  If you develop any new or worsening symptoms or if your symptoms do not start to improve, pleases return here or follow-up with your primary care provider. If your symptoms are severe, please go to the emergency room.     ED Prescriptions     Medication Sig Dispense Auth. Provider   fluticasone (FLONASE) 50 MCG/ACT nasal spray Place 1 spray into both nostrils daily. 16 g Carlisle Beers, FNP      PDMP not reviewed this encounter.   Carlisle Beers, [place] 05/16/23 1930

## 2023-05-16 NOTE — ED Triage Notes (Signed)
Pt reports hearing decrease and discomfort in right era x 1 week. Debrox gives no relief.

## 2023-05-16 NOTE — Discharge Instructions (Addendum)
We flushed out your ears today.  Pick up Flonase and use this daily.  Continue to avoid using Q-tips to the ears as this can cause worsening wax impaction. Use over-the-counter Debrox eardrops as needed to remove earwax.  If you develop any new or worsening symptoms or if your symptoms do not start to improve, pleases return here or follow-up with your primary care provider. If your symptoms are severe, please go to the emergency room.

## 2023-06-29 ENCOUNTER — Emergency Department (HOSPITAL_COMMUNITY)
Admission: EM | Admit: 2023-06-29 | Discharge: 2023-06-30 | Disposition: A | Discharge status: Home / Self Care | Payer: Federal, State, Local not specified - PPO | Attending: Emergency Medicine | Admitting: Emergency Medicine

## 2023-06-29 ENCOUNTER — Other Ambulatory Visit: Payer: Self-pay

## 2023-06-29 DIAGNOSIS — T8131XA Disruption of external operation (surgical) wound, not elsewhere classified, initial encounter: Secondary | ICD-10-CM | POA: Diagnosis not present

## 2023-06-29 DIAGNOSIS — L7622 Postprocedural hemorrhage and hematoma of skin and subcutaneous tissue following other procedure: Secondary | ICD-10-CM | POA: Insufficient documentation

## 2023-06-29 NOTE — ED Triage Notes (Signed)
Patient noticed bleeding at  her tummy tuck incision this evening .

## 2023-06-30 MED ORDER — BACITRACIN ZINC 500 UNIT/GM EX OINT
TOPICAL_OINTMENT | Freq: Two times a day (BID) | CUTANEOUS | Status: DC
  Administered 2023-06-30: 1 via TOPICAL
  Filled 2023-06-30: qty 0.9

## 2023-06-30 NOTE — ED Provider Notes (Signed)
[facility] Provider Note   CSN: [id number] Arrival date & time: 06/29/23  2138     History  Chief Complaint  Patient presents with   Incision Site Bleeding    Sharon [name] is a 57 y.o. female.  57 year old female presents to the emergency department for evaluation of bleeding wound.  She has a history of tummy tuck in April 2024 which was done in [place], [place].  She does some lifting at her job at [employer] and typically wears a soft waist trainer.  Today, decided to wear a latex waist shaper and body suit.  When she removed it after work she noticed some bleeding from the incision site of her tummy tuck.  No interventions prior to arrival.  She has no associated abdominal pain.  No nausea or vomiting, fevers.  The patient is not chronically anticoagulated.  The history is provided by the patient. No language interpreter was used.       Home Medications Prior to Admission medications   Medication Sig Start Date End Date Taking? Authorizing Provider  albuterol (VENTOLIN HFA) 108 (90 Base) MCG/ACT inhaler Inhale 2 puffs into the lungs every 6 (six) hours as needed for wheezing or shortness of breath (Cough). 11/14/22   Theadora Rama Scales, PA-C  azelastine (OPTIVAR) 0.05 % ophthalmic solution Place 1 drop into both eyes 2 (two) times daily. As needed for allergy symptoms 03/21/19   Janeece Agee, NP  cephALEXin (KEFLEX) 500 MG capsule Take 1 capsule (500 mg total) by mouth 3 (three) times daily. 03/26/23   Wallis Bamberg, PA-C  cetirizine (ZYRTEC ALLERGY) 10 MG tablet Take 1 tablet (10 mg total) by mouth daily. 03/26/23   Wallis Bamberg, PA-C  dicyclomine (BENTYL) 20 MG tablet Take 1 tablet (20 mg total) by mouth 2 (two) times daily. 06/20/20   Joy, Shawn C, PA-C  fexofenadine (ALLEGRA) 180 MG tablet Take 1 tablet (180 mg total) by mouth daily. 08/14/22   Wallis Bamberg, PA-C  fluticasone (FLONASE) 50 MCG/ACT nasal spray Place 1 spray into both nostrils  daily. 05/16/23   Carlisle Beers, FNP  guaifenesin (HUMIBID E) 400 MG TABS tablet Take 1 tablet 3 times daily as needed for chest congestion and cough 11/09/22   Theadora Rama Scales, PA-C  lisinopril-hydrochlorothiazide (ZESTORETIC) 20-25 MG tablet Take 1 tablet by mouth daily. 03/21/19   Janeece Agee, NP  montelukast (SINGULAIR) 10 MG tablet Take 1 tablet (10 mg total) by mouth at bedtime. 01/26/18   Weber, Dema Severin, PA-C  MOUNJARO 2.5 MG/0.5ML Pen Inject into the skin.    [provider]  ondansetron (ZOFRAN ODT) 4 MG disintegrating tablet Take 1 tablet (4 mg total) by mouth every 8 (eight) hours as needed for nausea or vomiting. 06/20/20   Joy, Shawn C, PA-C  phentermine 30 MG capsule Take by mouth daily. 02/27/19   [provider]  promethazine-dextromethorphan (PROMETHAZINE-DM) 6.25-15 MG/5ML syrup Take 2.5 mLs by mouth 3 (three) times daily as needed for cough. 03/26/23   Wallis Bamberg, PA-C  pseudoephedrine (SUDAFED) 60 MG tablet Take 1 tablet (60 mg total) by mouth every 8 (eight) hours as needed for congestion. 03/26/23   Wallis Bamberg, PA-C  triamcinolone cream (KENALOG) 0.5 % Apply 1 application topically 2 (two) times daily. Patient not taking: Reported on 04/18/2019 03/05/18   Valarie Cones, Dema Severin, PA-C  valACYclovir (VALTREX) 500 MG tablet TAKE 1 TABLET BY MOUTH 2 TIMES DIALY FOR 3 DAYS AS NEEDED OUTBREAK 03/21/19  Janeece Agee, NP      Allergies    Bee pollen, Codeine, Gramineae pollens, Pollen extract, and Metformin and related    Review of Systems   Review of Systems Ten systems reviewed and are negative for acute change, except as noted in the HPI.    Physical Exam Updated Vital Signs BP (!) 140/90   Pulse 64   Temp 98 F (36.7 C)   Resp 17   LMP 03/01/2015   SpO2 99%   Physical Exam Vitals and nursing note reviewed.  Constitutional:      General: She is not in acute distress.    Appearance: She is well-developed. She is not diaphoretic.  HENT:      Head: Normocephalic and atraumatic.  Eyes:     General: No scleral icterus.    Extraocular Movements: EOM normal.     Conjunctiva/sclera: Conjunctivae normal.  Pulmonary:     Effort: Pulmonary effort is normal. No respiratory distress.  Musculoskeletal:        General: Normal range of motion.     Cervical back: Normal range of motion.  Skin:    General: Skin is warm and dry.     Coloration: Skin is not pale.     Findings: No erythema or rash.     Comments: Superficial skin dehiscence along the site of prior tummy tuck incision. No active bleeding. No associated erythema, induration, purulence. No findings to suspect fistula formation.  Neurological:     Mental Status: She is alert and oriented to person, place, and time.  Psychiatric:        Mood and Affect: Mood and affect normal.        Behavior: Behavior normal.     ED Results / Procedures / Treatments   Labs (all labs ordered are listed, but only abnormal results are displayed) Labs Reviewed - No data to display  EKG None  Radiology No results found.  Procedures Procedures    Medications Ordered in ED Medications  bacitracin ointment (1 Application Topical Given 06/30/23 0041)    ED Course/ Medical Decision Making/ A&P                                 Medical Decision Making Risk OTC drugs.   This patient presents to the ED for concern of bleeding wound, this involves an extensive number of treatment options, and is a complaint that carries with it a high risk of complications and morbidity.  The differential diagnosis includes abscess vs skin tear vs fistula formation    Co morbidities that complicate the patient evaluation  Obesity    Additional history obtained:  External records from outside source obtained and reviewed including wound care visit on 05/14/23   Cardiac Monitoring:  The patient was maintained on a cardiac monitor.  I personally viewed and interpreted the cardiac monitored which  showed an underlying rhythm of: NSR   Medicines ordered and prescription drug management:  I ordered medication including bacitracin for wound care  I have reviewed the patients home medicines and have made adjustments as needed   Test Considered:  CT abdomen/pelvis   Problem List / ED Course:  Patient presenting for bleeding from incisional site of tummy tuck.  Surgery was completed in [place] in April 2024.  She reports previously being followed by the wound care center at [facility].  Wound care notes reference prior incisional site dehiscence in approximately  3 areas.  Patient reports these all healed well.  She continues to use an abdominal binder. Physical exam is very reassuring.  There is some dehiscence of the skin overlying her prior incisional site; however, this is superficial only and does not extend through the dermis.  There is no evidence of secondary infection or cellulitis/abscess.  No physical exam findings to suggest fistula formation.  There is no active bleeding at the time of exam either. No indication for further emergent imaging.  No associated abdominal pain or other symptoms to suggest intra-abdominal process.  Patient can continue basic wound care measures at home.  Will refer back to the wound care center should she desire additional outpatient follow-up.   Reevaluation:  After the interventions noted above, I reevaluated the patient and found that they have :stayed the same   Social Determinants of Health:  Good social support   Dispostion:  After consideration of the diagnostic results and the patients response to treatment, I feel that the patent would benefit from basic wound care and use of bacitracin to prevent infection. Given referral back to wound care should she desire additional f/u. Return precautions discussed and provided. Patient discharged in stable condition with no unaddressed concerns.          Final Clinical Impression(s) / ED  Diagnoses Final diagnoses:  Superficial disruption or dehiscence of operation wound, initial encounter    Rx / DC Orders ED Discharge Orders          Ordered    Ambulatory referral to Wound Clinic        06/30/23 0034              Antony Madura, PA-C 06/30/23 0154    Gilda Crease, MD 06/30/23 2329

## 2023-06-30 NOTE — Discharge Instructions (Addendum)
We recommend application of bacitracin twice a day.  Keep the area covered with a nonstick dressing until it is fully healed.  Should you desire follow-up, you may go see the wound care center again to ensure proper healing of your superficial skin wound.  Return to the ED for new or concerning symptoms.

## 2023-08-02 ENCOUNTER — Encounter (HOSPITAL_BASED_OUTPATIENT_CLINIC_OR_DEPARTMENT_OTHER): Payer: Federal, State, Local not specified - PPO | Admitting: Internal Medicine

## 2023-08-30 ENCOUNTER — Ambulatory Visit (HOSPITAL_BASED_OUTPATIENT_CLINIC_OR_DEPARTMENT_OTHER): Payer: Federal, State, Local not specified - PPO | Admitting: Internal Medicine

## 2023-10-02 ENCOUNTER — Encounter (HOSPITAL_BASED_OUTPATIENT_CLINIC_OR_DEPARTMENT_OTHER): Payer: Federal, State, Local not specified - PPO | Admitting: General Surgery

## 2023-11-08 ENCOUNTER — Ambulatory Visit
Admission: EM | Admit: 2023-11-08 | Discharge: 2023-11-08 | Disposition: A | Discharge status: Home / Self Care | Payer: BC Managed Care – PPO | Attending: Family Medicine | Admitting: Family Medicine

## 2023-11-08 DIAGNOSIS — J0100 Acute maxillary sinusitis, unspecified: Secondary | ICD-10-CM | POA: Diagnosis not present

## 2023-11-08 MED ORDER — AMOXICILLIN-POT CLAVULANATE 400-57 MG/5ML PO SUSR
875.0000 mg | Freq: Two times a day (BID) | ORAL | 0 refills | Status: AC
Start: 2023-11-08 — End: 2023-11-18

## 2023-11-08 MED ORDER — FLUTICASONE PROPIONATE 50 MCG/ACT NA SUSP
1.0000 | Freq: Every day | NASAL | 0 refills | Status: AC
Start: 2023-11-08 — End: ?

## 2023-11-08 NOTE — ED Provider Notes (Signed)
 [facility]    CSN: [id number] Arrival date & time: 11/08/23  1236      History   Chief Complaint No chief complaint on file.   HPI Sharon [name] is a 58 y.o. female  presents for evaluation of URI symptoms for 2 weeks. Patient reports associated symptoms of sinus pressure/pain with congestion, postnasal drip causing sore throat, ear pain. Denies N/V/D, fever, body aches, shortness of breath. Patient does not have a hx of asthma. Patient is not an active smoker.  Pt has taken decongestants and Coricidin OTC for symptoms. Pt has no other concerns at this time.   HPI  Past Medical History:  Diagnosis Date   Allergy    Hypertension    Seasonal allergies     Patient Active Problem List   Diagnosis Date Noted   Acute pain of right shoulder 02/21/2018   Right shoulder tendinitis 02/21/2018   Musculoskeletal pain 02/21/2018   Muscle spasm 02/21/2018   HSV (herpes simplex virus) infection 01/26/2018   Diverticulosis 06/21/2014   Fibroid, uterine 06/21/2014   GERD 08/06/2009   MORBID OBESITY 05/26/2008   Essential hypertension 02/19/2007    Past Surgical History:  Procedure Laterality Date   ABDOMINOPLASTY     BREAST EXCISIONAL BIOPSY Bilateral 1978   BREAST SURGERY      OB History   No obstetric history on file.      Home Medications    Prior to Admission medications   Medication Sig Start Date End Date Taking? Authorizing Provider  amoxicillin -clavulanate (AUGMENTIN ) 400-57 MG/5ML suspension Take 10.9 mLs (875 mg total) by mouth 2 (two) times daily for 10 days. 11/08/23 11/18/23 Yes Lashunda Greis, Jodi R, NP  fluticasone  (FLONASE ) 50 MCG/ACT nasal spray Place 1 spray into both nostrils daily. 11/08/23  Yes Bayard More, Jodi R, NP  albuterol  (VENTOLIN  HFA) 108 (90 Base) MCG/ACT inhaler Inhale 2 puffs into the lungs every 6 (six) hours as needed for wheezing or shortness of breath (Cough). 11/14/22   Joesph Shaver Scales, PA-C  azelastine  (OPTIVAR ) 0.05 % ophthalmic  solution Place 1 drop into both eyes 2 (two) times daily. As needed for allergy symptoms 03/21/19   Kip Ade, NP  cetirizine  (ZYRTEC  ALLERGY) 10 MG tablet Take 1 tablet (10 mg total) by mouth daily. 03/26/23   Christopher Savannah, PA-C  dicyclomine  (BENTYL ) 20 MG tablet Take 1 tablet (20 mg total) by mouth 2 (two) times daily. 06/20/20   Joy, Shawn C, PA-C  fexofenadine  (ALLEGRA ) 180 MG tablet Take 1 tablet (180 mg total) by mouth daily. 08/14/22   Christopher Savannah, PA-C  guaifenesin  (HUMIBID E) 400 MG TABS tablet Take 1 tablet 3 times daily as needed for chest congestion and cough 11/09/22   Joesph Shaver Scales, PA-C  lisinopril -hydrochlorothiazide  (ZESTORETIC ) 20-25 MG tablet Take 1 tablet by mouth daily. 03/21/19   Kip Ade, NP  montelukast  (SINGULAIR ) 10 MG tablet Take 1 tablet (10 mg total) by mouth at bedtime. 01/26/18   Weber, Lauraine CROME, PA-C  MOUNJARO 2.5 MG/0.5ML Pen Inject into the skin.    [provider]  ondansetron  (ZOFRAN  ODT) 4 MG disintegrating tablet Take 1 tablet (4 mg total) by mouth every 8 (eight) hours as needed for nausea or vomiting. 06/20/20   Joy, Shawn C, PA-C  phentermine 30 MG capsule Take by mouth daily. 02/27/19   [provider]  promethazine -dextromethorphan (PROMETHAZINE -DM) 6.25-15 MG/5ML syrup Take 2.5 mLs by mouth 3 (three) times daily as needed for cough. 03/26/23   Christopher Savannah, PA-C  pseudoephedrine  (SUDAFED) 60 MG tablet Take 1 tablet (60 mg total) by mouth every 8 (eight) hours as needed for congestion. 03/26/23   Christopher Savannah, PA-C  triamcinolone  cream (KENALOG ) 0.5 % Apply 1 application topically 2 (two) times daily. Patient not taking: Reported on 04/18/2019 03/05/18   Allen, Lauraine CROME, PA-C  valACYclovir  (VALTREX ) 500 MG tablet TAKE 1 TABLET BY MOUTH 2 TIMES DIALY FOR 3 DAYS AS NEEDED OUTBREAK 03/21/19   Kip Ade, NP    Family History Family History  Problem Relation Age of Onset   Diabetes Mother    Hypertension Father    Hypertension  Sister    Breast cancer Maternal Aunt     Social History Social History   Tobacco Use   Smoking status: Never   Smokeless tobacco: Never  Vaping Use   Vaping status: Never Used  Substance Use Topics   Alcohol use: No    Alcohol/week: 0.0 standard drinks of alcohol   Drug use: No     Allergies   Bee pollen, Codeine, Gramineae pollens, Pollen extract, and Metformin and related   Review of Systems Review of Systems  HENT:  Positive for congestion, postnasal drip, sinus pressure, sinus pain and sore throat.      Physical Exam Triage Vital Signs ED Triage Vitals  Encounter Vitals Group     BP 11/08/23 1355 (!) 171/106     Systolic BP Percentile --      Diastolic BP Percentile --      Pulse Rate 11/08/23 1355 63     Resp 11/08/23 1355 18     Temp 11/08/23 1355 98 F (36.7 C)     Temp Source 11/08/23 1355 Oral     SpO2 11/08/23 1355 96 %     Weight --      Height --      Head Circumference --      Peak Flow --      Pain Score 11/08/23 1354 3     Pain Loc --      Pain Education --      Exclude from Growth Chart --    No data found.  Updated Vital Signs BP 128/82 (BP Location: Left Arm)   Pulse 63   Temp 98 F (36.7 C) (Oral)   Resp 18   LMP 03/01/2015   SpO2 96%   Visual Acuity Right Eye Distance:   Left Eye Distance:   Bilateral Distance:    Right Eye Near:   Left Eye Near:    Bilateral Near:     Physical Exam Vitals and nursing note reviewed.  Constitutional:      General: She is not in acute distress.    Appearance: She is well-developed. She is not ill-appearing.  HENT:     Head: Normocephalic and atraumatic.     Right Ear: Tympanic membrane and ear canal normal.     Left Ear: Tympanic membrane and ear canal normal.     Nose: Congestion present.     Right Turbinates: Swollen and pale.     Left Turbinates: Swollen and pale.     Right Sinus: Maxillary sinus tenderness present. No frontal sinus tenderness.     Left Sinus: Maxillary sinus  tenderness present. No frontal sinus tenderness.     Mouth/Throat:     Mouth: Mucous membranes are moist.     Pharynx: Oropharynx is clear. Uvula midline. No oropharyngeal exudate or posterior oropharyngeal erythema.     Tonsils: No tonsillar exudate or  tonsillar abscesses.  Eyes:     Conjunctiva/sclera: Conjunctivae normal.     Pupils: Pupils are equal, round, and reactive to light.  Cardiovascular:     Rate and Rhythm: Normal rate and regular rhythm.     Heart sounds: Normal heart sounds.  Pulmonary:     Effort: Pulmonary effort is normal.     Breath sounds: Normal breath sounds.  Musculoskeletal:     Cervical back: Normal range of motion and neck supple.  Lymphadenopathy:     Cervical: No cervical adenopathy.  Skin:    General: Skin is warm and dry.  Neurological:     General: No focal deficit present.     Mental Status: She is alert and oriented to person, place, and time.  Psychiatric:        Mood and Affect: Mood normal.        Behavior: Behavior normal.      UC Treatments / Results  Labs (all labs ordered are listed, but only abnormal results are displayed) Labs Reviewed - No data to display  EKG   Radiology No results found.  Procedures Procedures (including critical care time)  Medications Ordered in UC Medications - No data to display  Initial Impression / Assessment and Plan / UC Course  I have reviewed the triage vital signs and the nursing notes.  Pertinent labs & imaging results that were available during my care of the patient were reviewed by me and considered in my medical decision making (see chart for details).     Reviewed exam and symptoms with patient.  No red flags.  Will start Augmentin  given length of symptoms.  Patient requested liquid.  Flonase  daily.  Nasal rinses as tolerated.  Patient BP elevated on intake with a history of hypertension.  She has been taking over-the-counter decongestants.  Advised patient to stop taking  over-the-counter decongestants.  On recheck BP normal. PCP follow-up if symptoms do not improve.  ER precautions reviewed. Final Clinical Impressions(s) / UC Diagnoses   Final diagnoses:  Acute maxillary sinusitis, recurrence not specified     Discharge Instructions      Start Augmentin  twice daily for 10 days.  Continue Flonase .  Nasal rinses such as Merrilyn Sieve as you tolerate.  Lots of rest and fluids.  Please follow-up with your PCP if your symptoms do not improve.  Please go to the ER if you develop any worsening symptoms.  I hope you feel better soon!    ED Prescriptions     Medication Sig Dispense Auth. Provider   amoxicillin -clavulanate (AUGMENTIN ) 400-57 MG/5ML suspension Take 10.9 mLs (875 mg total) by mouth 2 (two) times daily for 10 days. 218 mL Solan Vosler, Jodi R, NP   fluticasone  (FLONASE ) 50 MCG/ACT nasal spray Place 1 spray into both nostrils daily. 15.8 mL Kelaiah Escalona, Jodi R, NP      PDMP not reviewed this encounter.   Loreda Myla SAUNDERS, NP 11/08/23 [phone]

## 2023-11-08 NOTE — Discharge Instructions (Signed)
 Start Augmentin  twice daily for 10 days.  Continue Flonase .  Nasal rinses such as Merrilyn Sieve as you tolerate.  Lots of rest and fluids.  Please follow-up with your PCP if your symptoms do not improve.  Please go to the ER if you develop any worsening symptoms.  I hope you feel better soon!

## 2023-11-08 NOTE — ED Triage Notes (Signed)
 Pt presents with c/o sinus pressure and congestion X 2 wks.   Home interventions: Sudafed, no relief.   States she woke up this morning with ear pain, runny nose and sore throat.

## 2023-12-05 ENCOUNTER — Encounter (HOSPITAL_BASED_OUTPATIENT_CLINIC_OR_DEPARTMENT_OTHER): Payer: Federal, State, Local not specified - PPO | Attending: General Surgery | Admitting: General Surgery

## 2023-12-05 DIAGNOSIS — T8131XS Disruption of external operation (surgical) wound, not elsewhere classified, sequela: Secondary | ICD-10-CM | POA: Insufficient documentation

## 2023-12-05 DIAGNOSIS — S31109S Unspecified open wound of abdominal wall, unspecified quadrant without penetration into peritoneal cavity, sequela: Secondary | ICD-10-CM | POA: Insufficient documentation

## 2024-02-26 ENCOUNTER — Ambulatory Visit
Admission: EM | Admit: 2024-02-26 | Discharge: 2024-02-26 | Disposition: A | Discharge status: Home / Self Care | Attending: Family Medicine | Admitting: Family Medicine

## 2024-02-26 DIAGNOSIS — J4530 Mild persistent asthma, uncomplicated: Secondary | ICD-10-CM

## 2024-02-26 DIAGNOSIS — J309 Allergic rhinitis, unspecified: Secondary | ICD-10-CM

## 2024-02-26 MED ORDER — PREDNISONE 20 MG PO TABS: ORAL_TABLET | ORAL | 0 refills | Status: AC

## 2024-02-26 MED ORDER — BENZONATATE 100 MG PO CAPS: 100.0000 mg | ORAL_CAPSULE | Freq: Three times a day (TID) | ORAL | 0 refills | Status: AC | PRN

## 2024-02-26 NOTE — ED Provider Notes (Signed)
 [facility]  Note:  This document was prepared using Conservation officer, historic buildings and may include unintentional dictation errors.  MRN: [id number] DOB: Jan 09, 1966  Subjective:   Sharon [name] is a 58 y.o. female presenting for 2-day history of watering of her eyes, sneezing, coughing, drainage, chest tightness.  Patient has been taking her allergy medicine, Zyrtec , Singulair .  Has been using cough capsules as well which really do help and needs a refill.  No active shortness of breath, wheezing, fever, body pains.  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  No smoking of any kind including cigarettes, cigars, vaping, marijuana use.    No current facility-administered medications for this encounter.  Current Outpatient Medications:    albuterol  (VENTOLIN  HFA) 108 (90 Base) MCG/ACT inhaler, Inhale 2 puffs into the lungs every 6 (six) hours as needed for wheezing or shortness of breath (Cough)., Disp: 18 g, Rfl: 0   azelastine  (OPTIVAR ) 0.05 % ophthalmic solution, Place 1 drop into both eyes 2 (two) times daily. As needed for allergy symptoms, Disp: 6 mL, Rfl: 12   cetirizine  (ZYRTEC  ALLERGY) 10 MG tablet, Take 1 tablet (10 mg total) by mouth daily., Disp: 30 tablet, Rfl: 0   dicyclomine  (BENTYL ) 20 MG tablet, Take 1 tablet (20 mg total) by mouth 2 (two) times daily., Disp: 20 tablet, Rfl: 0   fexofenadine  (ALLEGRA ) 180 MG tablet, Take 1 tablet (180 mg total) by mouth daily., Disp: 90 tablet, Rfl: 0   fluticasone  (FLONASE ) 50 MCG/ACT nasal spray, Place 1 spray into both nostrils daily., Disp: 15.8 mL, Rfl: 0   guaifenesin  (HUMIBID E) 400 MG TABS tablet, Take 1 tablet 3 times daily as needed for chest congestion and cough, Disp: 21 tablet, Rfl: 0   lisinopril -hydrochlorothiazide  (ZESTORETIC ) 20-25 MG tablet, Take 1 tablet by mouth daily., Disp: 30 tablet, Rfl: 0    montelukast  (SINGULAIR ) 10 MG tablet, Take 1 tablet (10 mg total) by mouth at bedtime., Disp: 90 tablet, Rfl: 3   MOUNJARO 2.5 MG/0.5ML Pen, Inject into the skin., Disp: , Rfl:    ondansetron  (ZOFRAN  ODT) 4 MG disintegrating tablet, Take 1 tablet (4 mg total) by mouth every 8 (eight) hours as needed for nausea or vomiting., Disp: 20 tablet, Rfl: 0   phentermine 30 MG capsule, Take by mouth daily., Disp: , Rfl:    promethazine -dextromethorphan (PROMETHAZINE -DM) 6.25-15 MG/5ML syrup, Take 2.5 mLs by mouth 3 (three) times daily as needed for cough., Disp: 100 mL, Rfl: 0   pseudoephedrine  (SUDAFED) 60 MG tablet, Take 1 tablet (60 mg total) by mouth every 8 (eight) hours as needed for congestion., Disp: 30 tablet, Rfl: 0   triamcinolone  cream (KENALOG ) 0.5 %, Apply 1 application topically 2 (two) times daily. (Patient not taking: Reported on 04/18/2019), Disp: 30 g, Rfl: 0   valACYclovir  (VALTREX ) 500 MG tablet, TAKE 1 TABLET BY MOUTH 2 TIMES DIALY FOR 3 DAYS AS NEEDED OUTBREAK, Disp: 30 tablet, Rfl: 0   Allergies  Allergen Reactions   Bee Pollen Other (See Comments)   Codeine Nausea And Vomiting, Other (See Comments) and Nausea Only    Light headedness  Light headedness  Light headedness  Light headedness  Light headedness  Light headedness   Gramineae Pollens Other (See Comments)   Pollen Extract Other (See Comments)   Metformin And Related Diarrhea    Past Medical History:  Diagnosis Date  Allergy    Hypertension    Seasonal allergies      Past Surgical History:  Procedure Laterality Date   ABDOMINOPLASTY     BREAST EXCISIONAL BIOPSY Bilateral 1978   BREAST SURGERY      Family History  Problem Relation Age of Onset   Diabetes Mother    Hypertension Father    Hypertension Sister    Breast cancer Maternal Aunt     Social History   Tobacco Use   Smoking status: Never   Smokeless tobacco: Never  Vaping Use   Vaping status: Never Used  Substance Use Topics   Alcohol  use: No    Alcohol/week: 0.0 standard drinks of alcohol   Drug use: No    ROS   Objective:   Vitals: BP 136/89 (BP Location: Left Arm)   Pulse (!) 56   Temp 97.9 F (36.6 C) (Oral)   Resp 20   LMP 03/01/2015   SpO2 93%   Wt Readings from Last 3 Encounters:  03/21/19 228 lb (103.4 kg)  07/18/18 223 lb 6.4 oz (101.3 kg)  03/05/18 225 lb 9.6 oz (102.3 kg)   Temp Readings from Last 3 Encounters:  02/26/24 97.9 F (36.6 C) (Oral)  11/08/23 98 F (36.7 C) (Oral)  06/30/23 98 F (36.7 C)   BP Readings from Last 3 Encounters:  02/26/24 (!) 160/99  11/08/23 128/82  06/30/23 (!) 140/90   Pulse Readings from Last 3 Encounters:  02/26/24 (!) 56  11/08/23 63  06/30/23 64     Physical Exam Constitutional:      General: She is not in acute distress.    Appearance: Normal appearance. She is well-developed and normal weight. She is not ill-appearing, toxic-appearing or diaphoretic.  HENT:     Head: Normocephalic and atraumatic.     Right Ear: Tympanic membrane, ear canal and external ear normal. No drainage or tenderness. No middle ear effusion. There is no impacted cerumen. Tympanic membrane is not erythematous or bulging.     Left Ear: Tympanic membrane, ear canal and external ear normal. No drainage or tenderness.  No middle ear effusion. There is no impacted cerumen. Tympanic membrane is not erythematous or bulging.     Nose: Congestion present. No rhinorrhea.     Mouth/Throat:     Mouth: Mucous membranes are moist. No oral lesions.     Pharynx: No pharyngeal swelling, oropharyngeal exudate, posterior oropharyngeal erythema or uvula swelling.     Tonsils: No tonsillar exudate or tonsillar abscesses.     Comments: Cobblestone pattern postnasal drainage overlying pharynx. Eyes:     General: No scleral icterus.       Right eye: No discharge.        Left eye: No discharge.     Extraocular Movements: Extraocular movements intact.     Right eye: Normal extraocular motion.      Left eye: Normal extraocular motion.     Conjunctiva/sclera: Conjunctivae normal.  Cardiovascular:     Rate and Rhythm: Normal rate and regular rhythm.     Heart sounds: Normal heart sounds. No murmur heard.    No friction rub. No gallop.  Pulmonary:     Effort: Pulmonary effort is normal. No respiratory distress.     Breath sounds: No stridor. No wheezing, rhonchi or rales.  Chest:     Chest wall: No tenderness.  Musculoskeletal:     Cervical back: Normal range of motion and neck supple.  Lymphadenopathy:     Cervical:  No cervical adenopathy.  Skin:    General: Skin is warm and dry.  Neurological:     General: No focal deficit present.     Mental Status: She is alert and oriented to person, place, and time.     Cranial Nerves: No cranial nerve deficit.     Motor: No weakness.     Coordination: Coordination normal.     Gait: Gait normal.  Psychiatric:        Mood and Affect: Mood normal.        Behavior: Behavior normal.     Assessment and Plan :   PDMP not reviewed this encounter.  1. Mild persistent extrinsic asthma without complication   2. Allergic rhinitis, unspecified seasonality, unspecified trigger     Recommend an oral prednisone  course for her allergic rhinitis and asthma.  Maintain regular medications.  Regarding her blood pressure emphasized compliance with her medications as patient does not always take it daily.  No signs of an acute encephalopathy.  Deferred imaging given clear cardiopulmonary exam, hemodynamically stable vital signs.  Counseled patient on potential for adverse effects with medications prescribed/recommended today, ER and return-to-clinic precautions discussed, patient verbalized understanding.    Adolph Hoop, [place] 02/26/24 1326

## 2024-02-26 NOTE — ED Triage Notes (Addendum)
 Pt c/o congested nose, cough, difficulty lying flat watery eyes x2 days. Taking singulair ,zyrtec , vicks and cough tablets

## 2024-03-25 ENCOUNTER — Other Ambulatory Visit: Payer: Self-pay

## 2024-03-25 ENCOUNTER — Emergency Department (HOSPITAL_COMMUNITY)

## 2024-03-25 ENCOUNTER — Emergency Department (HOSPITAL_COMMUNITY)
Admission: EM | Admit: 2024-03-25 | Discharge: 2024-03-25 | Disposition: A | Discharge status: Home / Self Care | Attending: Emergency Medicine | Admitting: Emergency Medicine

## 2024-03-25 ENCOUNTER — Encounter (HOSPITAL_COMMUNITY): Payer: Self-pay

## 2024-03-25 DIAGNOSIS — S0990XA Unspecified injury of head, initial encounter: Secondary | ICD-10-CM | POA: Insufficient documentation

## 2024-03-25 DIAGNOSIS — M542 Cervicalgia: Secondary | ICD-10-CM | POA: Insufficient documentation

## 2024-03-25 DIAGNOSIS — Y92410 Unspecified street and highway as the place of occurrence of the external cause: Secondary | ICD-10-CM | POA: Insufficient documentation

## 2024-03-25 DIAGNOSIS — Z79899 Other long term (current) drug therapy: Secondary | ICD-10-CM | POA: Insufficient documentation

## 2024-03-25 DIAGNOSIS — M546 Pain in thoracic spine: Secondary | ICD-10-CM | POA: Insufficient documentation

## 2024-03-25 DIAGNOSIS — I10 Essential (primary) hypertension: Secondary | ICD-10-CM | POA: Diagnosis not present

## 2024-03-25 MED ORDER — NAPROXEN 500 MG PO TABS: 500.0000 mg | ORAL_TABLET | Freq: Two times a day (BID) | ORAL | 0 refills | Status: AC

## 2024-03-25 MED ORDER — NAPROXEN 500 MG PO TABS
500.0000 mg | ORAL_TABLET | Freq: Once | ORAL | Status: AC
  Administered 2024-03-25: 500 mg via ORAL
  Filled 2024-03-25: qty 1

## 2024-03-25 MED ORDER — CYCLOBENZAPRINE HCL 10 MG PO TABS
10.0000 mg | ORAL_TABLET | Freq: Once | ORAL | Status: AC
  Administered 2024-03-25: 10 mg via ORAL
  Filled 2024-03-25: qty 1

## 2024-03-25 MED ORDER — CYCLOBENZAPRINE HCL 10 MG PO TABS: 10.0000 mg | ORAL_TABLET | Freq: Two times a day (BID) | ORAL | 0 refills | Status: AC | PRN

## 2024-03-25 NOTE — Discharge Instructions (Addendum)
 Thank you for letting us  evaluate you today.  Your CT of your head did not show any bleeding.  Your neck and thoracic spine did not show any fracture.  Please follow-up with primary care provider if you continue to have pain however I suspect that you may be sore for the next 7-10 days.  I have sent naproxen  and Flexeril  to your pharmacy for symptoms. You may use naproxen  and Tylenol intermittently every 8 hours as needed for pain.  Please do not use naproxen  with aspirin, Aleve , ibuprofen , Advil  as they are all in the same family. Robaxin may cause drowsiness so do not operate heavy machinery including driving or drink alcohol with this.  You may take this at night or split the tablet in half if it makes you too drowsy.  As your CT head was negative, we always treat as if you had a concussion.  This means "brain rest".  Decrease screen time, lites, plenty of sleeping and rest  Turn to emergency department if you experience significant worsening symptoms, chest pain, shortness of breath, syncope, numbness or tingling in one-sided body
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
## 2024-06-02 ENCOUNTER — Encounter: Payer: Self-pay | Admitting: Plastic Surgery

## 2024-06-02 ENCOUNTER — Ambulatory Visit (INDEPENDENT_AMBULATORY_CARE_PROVIDER_SITE_OTHER): Payer: Self-pay | Admitting: Plastic Surgery

## 2024-06-02 VITALS — BP 122/84 | HR 62 | Ht 66.0 in | Wt 213.2 lb

## 2024-06-02 DIAGNOSIS — Z719 Counseling, unspecified: Secondary | ICD-10-CM | POA: Insufficient documentation

## 2024-06-02 NOTE — Progress Notes (Signed)
 Patient ID: Sharon [name], female    DOB: [birth year], 58 y.o.   MRN: [id number]   Chief Complaint  Patient presents with   Consult    The patient is a 58 year old female here for evaluation of her face.  The patient is interested in either a face or neck lift.  She has very nice skin and nice homogeneity of her face and neck area.  She has very little wrinkling.  The majority of what she does not like is the fullness in the submental area.  She has had an abdominoplasty in the past but is not happy with the scar.  My concern for her for facelift is the scar and the inability to hide the preauricular scar.  We talked about Kybella and liposuction.  Cool sculpting is less predictable but is another option.    Review of Systems  Constitutional: Negative.   HENT: Negative.    Eyes: Negative.   Respiratory: Negative.    Cardiovascular: Negative.   Gastrointestinal: Negative.   Endocrine: Negative.   Genitourinary: Negative.   Musculoskeletal: Negative.   Skin: Negative.     Past Medical History:  Diagnosis Date   Allergy    Hypertension    Seasonal allergies     Past Surgical History:  Procedure Laterality Date   ABDOMINOPLASTY     BREAST EXCISIONAL BIOPSY Bilateral 1978   BREAST SURGERY        Current Outpatient Medications:    albuterol  (VENTOLIN  HFA) 108 (90 Base) MCG/ACT inhaler, Inhale 2 puffs into the lungs every 6 (six) hours as needed for wheezing or shortness of breath (Cough)., Disp: 18 g, Rfl: 0   azelastine  (OPTIVAR ) 0.05 % ophthalmic solution, Place 1 drop into both eyes 2 (two) times daily. As needed for allergy symptoms, Disp: 6 mL, Rfl: 12   benzonatate  (TESSALON ) 100 MG capsule, Take 1 capsule (100 mg total) by mouth 3 (three) times daily as needed for cough., Disp: 30 capsule, Rfl: 0   cetirizine  (ZYRTEC  ALLERGY) 10 MG tablet, Take 1 tablet (10 mg total) by mouth daily., Disp: 30 tablet, Rfl: 0   cyclobenzaprine  (FLEXERIL ) 10 MG tablet, Take 1 tablet (10  mg total) by mouth 2 (two) times daily as needed for muscle spasms., Disp: 20 tablet, Rfl: 0   dicyclomine  (BENTYL ) 20 MG tablet, Take 1 tablet (20 mg total) by mouth 2 (two) times daily., Disp: 20 tablet, Rfl: 0   fluticasone  (FLONASE ) 50 MCG/ACT nasal spray, Place 1 spray into both nostrils daily., Disp: 15.8 mL, Rfl: 0   guaifenesin  (HUMIBID E) 400 MG TABS tablet, Take 1 tablet 3 times daily as needed for chest congestion and cough, Disp: 21 tablet, Rfl: 0   lisinopril -hydrochlorothiazide  (ZESTORETIC ) 20-25 MG tablet, Take 1 tablet by mouth daily., Disp: 30 tablet, Rfl: 0   montelukast  (SINGULAIR ) 10 MG tablet, Take 1 tablet (10 mg total) by mouth at bedtime., Disp: 90 tablet, Rfl: 3   MOUNJARO 2.5 MG/0.5ML Pen, Inject into the skin., Disp: , Rfl:    naproxen  (NAPROSYN ) 500 MG tablet, Take 1 tablet (500 mg total) by mouth 2 (two) times daily., Disp: 30 tablet, Rfl: 0   ondansetron  (ZOFRAN  ODT) 4 MG disintegrating tablet, Take 1 tablet (4 mg total) by mouth every 8 (eight) hours as needed for nausea or vomiting., Disp: 20 tablet, Rfl: 0   phentermine 30 MG capsule, Take by mouth daily., Disp: , Rfl:    predniSONE  (DELTASONE ) 20 MG tablet, Take 2 tablets  daily with breakfast., Disp: 10 tablet, Rfl: 0   promethazine -dextromethorphan (PROMETHAZINE -DM) 6.25-15 MG/5ML syrup, Take 2.5 mLs by mouth 3 (three) times daily as needed for cough., Disp: 100 mL, Rfl: 0   pseudoephedrine  (SUDAFED) 60 MG tablet, Take 1 tablet (60 mg total) by mouth every 8 (eight) hours as needed for congestion., Disp: 30 tablet, Rfl: 0   triamcinolone  cream (KENALOG ) 0.5 %, Apply 1 application topically 2 (two) times daily. (Patient not taking: Reported on 04/18/2019), Disp: 30 g, Rfl: 0   valACYclovir  (VALTREX ) 500 MG tablet, TAKE 1 TABLET BY MOUTH 2 TIMES DIALY FOR 3 DAYS AS NEEDED OUTBREAK, Disp: 30 tablet, Rfl: 0   Objective:   Vitals:   06/02/24 0913  BP: 122/84  Pulse: 62  SpO2: 95%    Physical Exam Vitals  reviewed.  Constitutional:      Appearance: Normal appearance.  HENT:     Head: Atraumatic.  Cardiovascular:     Rate and Rhythm: Normal rate.     Pulses: Normal pulses.  Skin:    General: Skin is warm.     Capillary Refill: Capillary refill takes less than 2 seconds.     Coloration: Skin is not jaundiced.     Findings: No bruising.  Neurological:     Mental Status: She is alert and oriented to person, place, and time.  Psychiatric:        Mood and Affect: Mood normal.        Behavior: Behavior normal.        Thought Content: Thought content normal.        Judgment: Judgment normal.     Assessment & Plan:  Encounter for counseling  I have sent the patient information about high Bella.  Liposuction is another option but would require surgery.  The patient is going to think it over and let us  know what she decides.  Pictures were obtained of the patient and placed in the chart with the patient's or guardian's permission.  Estefana RAMAN Makenli Derstine, DO
